# Patient Record
Sex: Female | Born: 1980 | Hispanic: Yes | Marital: Married | Smoking: Never smoker
Health system: Southern US, Community
[De-identification: ages and names within clinical notes are randomized; demographics above are authoritative.]

## PROBLEM LIST (undated history)

## (undated) ENCOUNTER — Emergency Department (HOSPITAL_COMMUNITY): Payer: Managed Care, Other (non HMO)

## (undated) DIAGNOSIS — K219 Gastro-esophageal reflux disease without esophagitis: Secondary | ICD-10-CM

## (undated) DIAGNOSIS — J45990 Exercise induced bronchospasm: Secondary | ICD-10-CM

## (undated) DIAGNOSIS — Z8489 Family history of other specified conditions: Secondary | ICD-10-CM

## (undated) DIAGNOSIS — N939 Abnormal uterine and vaginal bleeding, unspecified: Secondary | ICD-10-CM

## (undated) DIAGNOSIS — F419 Anxiety disorder, unspecified: Secondary | ICD-10-CM

## (undated) DIAGNOSIS — T7840XA Allergy, unspecified, initial encounter: Secondary | ICD-10-CM

## (undated) DIAGNOSIS — A64 Unspecified sexually transmitted disease: Secondary | ICD-10-CM

## (undated) DIAGNOSIS — M1990 Unspecified osteoarthritis, unspecified site: Secondary | ICD-10-CM

## (undated) HISTORY — DX: Unspecified osteoarthritis, unspecified site: M19.90

## (undated) HISTORY — DX: Anxiety disorder, unspecified: F41.9

## (undated) HISTORY — DX: Abnormal uterine and vaginal bleeding, unspecified: N93.9

## (undated) HISTORY — DX: Allergy, unspecified, initial encounter: T78.40XA

## (undated) HISTORY — PX: LASIK: SHX215

## (undated) HISTORY — DX: Gastro-esophageal reflux disease without esophagitis: K21.9

## (undated) HISTORY — PX: ABDOMINAL HYSTERECTOMY: SHX81

## (undated) HISTORY — DX: Unspecified sexually transmitted disease: A64

## (undated) HISTORY — PX: COLPOSCOPY: SHX161

## (undated) HISTORY — PX: WISDOM TOOTH EXTRACTION: SHX21

## (undated) HISTORY — DX: Exercise induced bronchospasm: J45.990

---

## 2006-04-04 LAB — CONVERTED CEMR LAB: Pap Smear: NORMAL

## 2008-09-09 ENCOUNTER — Encounter: Payer: Self-pay | Admitting: Family Medicine

## 2009-04-09 ENCOUNTER — Ambulatory Visit: Payer: Self-pay | Admitting: Family Medicine

## 2009-04-09 DIAGNOSIS — K219 Gastro-esophageal reflux disease without esophagitis: Secondary | ICD-10-CM

## 2009-04-09 DIAGNOSIS — J45990 Exercise induced bronchospasm: Secondary | ICD-10-CM

## 2009-04-09 HISTORY — DX: Exercise induced bronchospasm: J45.990

## 2009-04-09 HISTORY — DX: Gastro-esophageal reflux disease without esophagitis: K21.9

## 2010-05-04 NOTE — Assessment & Plan Note (Signed)
Summary: NEW TO EST//CCM   Vital Signs:  Patient profile:   30 year old female Menstrual status:  regular LMP:     03/25/2009 Height:      63.75 inches Weight:      142 pounds BMI:     24.65 Temp:     97.7 degrees F oral Pulse rate:   72 / minute Pulse rhythm:   regular Resp:     12 per minute BP sitting:   100 / 70  (left arm) Cuff size:   regular  Vitals Entered By: Sid Falcon LPN (April 09, 2009 11:17 AM) CC: New to establish LMP (date): 03/25/2009     Menstrual Status regular Enter LMP: 03/25/2009 Last PAP Result normal   History of Present Illness: New patient to establish care. 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 Also question of polycystic ovary syndrome. Patient relates that she lost over 60 pounds couple years ago through lifestyle change and exercise and feels much better overall. Reportedly had some mildly elevated blood sugars prior to that. She has noticed improvement all areas including digestive health since losing her weight. She has worked with Systems analyst in the past.  Patient on no medications. Allergies to penicillin, clindamycin, and azithromycin. Had Pap smear little over year ago and plans to set up with gynecologist.  Family history reviewed as recorded. 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 Recently moved here from [place]. No history of smoking. Occasional alcohol use.  Preventive Screening-Counseling & Management  Alcohol-Tobacco     Smoking Status: never  Caffeine-Diet-Exercise     Does Patient Exercise: yes  Allergies (verified): 1)  ! Penicillin V Potassium (Penicillin V Potassium) 2)  ! Zithromax (Azithromycin) 3)  ! Mucinex (Guaifenesin)  Past History:  Family History: Last updated: 04/09/2009 Family History of Alcoholism/Addiction, father, both grandparents, aunt Family History of Arthritis, both parents and both  grandparents Breast cancer, great grandmother, aunt Family History High cholesterol, both parents and grandparents Family History Hypertension, father, grandparents Diabetes, father, aunt  Social History: Last updated: 04/09/2009 Occupation:  Rad tech Single Never Smoked Alcohol use-yes Regular exercise-yes  Risk Factors: Exercise: yes (04/09/2009)  Risk Factors: Smoking Status: never (04/09/2009)  Past Medical History: Arthritis Asthma GERD Hay fever, allergies  Family History: Family History of Alcoholism/Addiction, father, both grandparents, aunt Family History of Arthritis, both parents and both grandparents Breast cancer, great grandmother, aunt Family History High cholesterol, both parents and grandparents Family History Hypertension, father, grandparents Diabetes, father, aunt  Social History: Occupation:  Rad Adult nurse Never Smoked Alcohol use-yes Regular exercise-yes Occupation:  employed Smoking Status:  never Does Patient Exercise:  yes  Review of Systems  The patient denies anorexia, fever, weight gain, vision loss, decreased hearing, chest pain, syncope, dyspnea on exertion, peripheral edema, prolonged cough, headaches, hemoptysis, abdominal pain, melena, hematochezia, severe indigestion/heartburn, hematuria, and incontinence.    Physical Exam  General:  Well-developed,well-nourished,in no acute distress; alert,appropriate and cooperative throughout examination Eyes:  No corneal or conjunctival inflammation noted. EOMI. Perrla. Funduscopic exam benign, without hemorrhages, exudates or papilledema. Vision grossly normal. Ears:  External ear exam shows no significant lesions or deformities.  Otoscopic examination reveals clear canals, tympanic membranes are intact bilaterally without bulging, retraction, inflammation or discharge. Hearing is grossly normal bilaterally. Nose:  External nasal examination shows no deformity or inflammation. Nasal mucosa  are pink and moist without lesions or exudates. Mouth:  Oral mucosa and oropharynx without lesions or exudates.  Teeth in good repair. Neck:  No deformities, masses, or tenderness noted. Lungs:  Normal respiratory effort, chest expands symmetrically. Lungs are clear to auscultation, no crackles or wheezes. Heart:  Normal rate and regular rhythm. S1 and S2 normal without gallop, murmur, click, rub or other extra sounds.   Impression & Recommendations:  Problem # 1:  GERD (ICD-530.81) symptoms mild, stabe, and improved with weight loss  Problem # 2:  ASTHMA, EXERCISE INDUCED (ICD-493.81)  Patient Instructions: 1)  Consider CPE sometime later this year.  Preventive Care Screening  Pap Smear:    Date:  04/04/2006    Results:  normal

## 2010-05-04 NOTE — Letter (Signed)
Summary: [facility]   Imported By: Maryln Gottron 04/14/2009 11:28:24  _____________________________________________________________________  External Attachment:    Type:   Image     Comment:   External Document

## 2010-10-25 ENCOUNTER — Encounter: Payer: Self-pay | Admitting: Family Medicine

## 2010-10-26 ENCOUNTER — Ambulatory Visit: Payer: Self-pay | Admitting: Family Medicine

## 2010-10-26 ENCOUNTER — Encounter: Payer: Self-pay | Admitting: Family Medicine

## 2012-04-04 DIAGNOSIS — A64 Unspecified sexually transmitted disease: Secondary | ICD-10-CM

## 2012-04-04 HISTORY — DX: Unspecified sexually transmitted disease: A64

## 2012-04-04 LAB — HM PAP SMEAR: HM Pap smear: ABNORMAL

## 2012-08-02 ENCOUNTER — Encounter: Payer: Self-pay | Admitting: Family Medicine

## 2012-08-02 ENCOUNTER — Ambulatory Visit (INDEPENDENT_AMBULATORY_CARE_PROVIDER_SITE_OTHER): Payer: Commercial Managed Care - PPO | Admitting: Family Medicine

## 2012-08-02 VITALS — BP 100/60 | HR 72 | Temp 98.5°F | Resp 12 | Ht 63.75 in | Wt 132.0 lb

## 2012-08-02 DIAGNOSIS — Z0000 Encounter for general adult medical examination without abnormal findings: Secondary | ICD-10-CM

## 2012-08-02 NOTE — Progress Notes (Signed)
Subjective:    Patient ID: Cathy Austin, female    DOB: 20-Feb-1981, 32 y.o.   MRN: [id number]  HPI Patient seen for well visit She continues to see gynecologist for Pap smears. She is currently taking oral contraceptives apparently secondary to a history of ovarian cyst She has no real chronic problems other than mild intermittent asthma and history of reflux Reflux is been controlled with lifestyle modification.  She's had some chronic diffuse back pains. 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 She also some chronic tension-type headaches. Complains of some generalized fatigue.  Last tetanus not confirmed but she thinks less than 10 years ago Past Medical History  Diagnosis Date  . Arthritis   . Asthma   . GERD (gastroesophageal reflux disease)   . Allergy   . ASTHMA, EXERCISE INDUCED 04/09/2009  . GERD 04/09/2009   No past surgical history on file.  reports that she has never smoked. She does not have any smokeless tobacco history on file. She reports that  drinks alcohol. Her drug history is not on file. family history includes Alcohol abuse in her father; Arthritis in her father and mother; Cancer in an unspecified family member; Diabetes in her father; Hyperlipidemia in her father and mother; and Hypertension in her father. Allergies  Allergen Reactions  . Azithromycin     REACTION: chest tigntness, diarrhea  . Guaifenesin     REACTION: throat spasms  . Penicillins     REACTION: throat spasm, rash      Review of Systems  Constitutional: Positive for fatigue. Negative for fever, activity change, appetite change and unexpected weight change.  HENT: Negative for hearing loss, ear pain, sore throat and trouble swallowing.   Eyes: Negative for visual disturbance.  Respiratory: Negative for cough and shortness of breath.   Cardiovascular: Negative for chest pain and palpitations.  Gastrointestinal: Negative for abdominal pain, diarrhea,  constipation and blood in stool.  Genitourinary: Negative for dysuria and hematuria.  Musculoskeletal: Positive for back pain. Negative for myalgias and arthralgias.  Skin: Negative for rash.  Neurological: Positive for headaches. Negative for dizziness and syncope.  Hematological: Negative for adenopathy.  Psychiatric/Behavioral: Negative for confusion and dysphoric mood.       Objective:   Physical Exam  Constitutional: She is oriented to person, place, and time. She appears well-developed and well-nourished.  HENT:  Right Ear: External ear normal.  Left Ear: External ear normal.  Mouth/Throat: Oropharynx is clear and moist.  Eyes: Pupils are equal, round, and reactive to light.  Neck: Neck supple. No thyromegaly present.  Cardiovascular: Normal rate and regular rhythm.  Exam reveals no gallop.   Pulmonary/Chest: Effort normal and breath sounds normal. No respiratory distress. She has no wheezes. She has no rales.  Abdominal: Soft. Bowel sounds are normal. She exhibits no distension and no mass. There is no tenderness. There is no rebound and no guarding.  Musculoskeletal: She exhibits no edema.  Lymphadenopathy:    She has no cervical adenopathy.  Neurological: She is alert and oriented to person, place, and time. No cranial nerve deficit.  Skin: No rash noted.  Psychiatric: She has a normal mood and affect. Her behavior is normal.          Assessment & Plan:  Health maintenance. Confirm date of last tetanus. Future labs ordered for tomorrow as she's not fasting today. She will continue to see gynecologist. We discussed possible physical therapy for her back pain but she currently has  no coverage for that. We discussed some home exercises

## 2012-08-03 ENCOUNTER — Other Ambulatory Visit: Payer: Commercial Managed Care - PPO

## 2012-08-03 LAB — CBC WITH DIFFERENTIAL/PLATELET
Basophils Absolute: 0 10*3/uL (ref 0.0–0.1)
Basophils Relative: 0.5 % (ref 0.0–3.0)
Eosinophils Absolute: 0.1 10*3/uL (ref 0.0–0.7)
Eosinophils Relative: 1.9 % (ref 0.0–5.0)
HCT: 38.2 % (ref 36.0–46.0)
Hemoglobin: 13.1 g/dL (ref 12.0–15.0)
Lymphocytes Relative: 44.5 % (ref 12.0–46.0)
Lymphs Abs: 2.2 10*3/uL (ref 0.7–4.0)
MCHC: 34.2 g/dL (ref 30.0–36.0)
MCV: 95 fl (ref 78.0–100.0)
Monocytes Absolute: 0.3 10*3/uL (ref 0.1–1.0)
Monocytes Relative: 6.3 % (ref 3.0–12.0)
Neutro Abs: 2.3 10*3/uL (ref 1.4–7.7)
Neutrophils Relative %: 46.8 % (ref 43.0–77.0)
Platelets: 308 10*3/uL (ref 150.0–400.0)
RBC: 4.02 Mil/uL (ref 3.87–5.11)
RDW: 12.6 % (ref 11.5–14.6)
WBC: 4.9 10*3/uL (ref 4.5–10.5)

## 2012-08-03 LAB — TSH: TSH: 1.25 u[IU]/mL (ref 0.35–5.50)

## 2012-08-06 LAB — BASIC METABOLIC PANEL
GFR: 109.33 mL/min (ref >60.00)
Glucose, Bld: 80 mg/dL (ref 70–99)
Potassium: 4.1 mEq/L (ref 3.5–5.1)
Sodium: 137 mEq/L (ref 135–145)

## 2012-08-06 LAB — LIPID PANEL
Cholesterol: 184 mg/dL (ref 0–200)
HDL: 84.9 mg/dL (ref >39.00)
Triglycerides: 73 mg/dL (ref 0.0–149.0)
VLDL: 14.6 mg/dL (ref 0.0–40.0)

## 2012-08-06 LAB — HEPATIC FUNCTION PANEL
Albumin: 3.8 g/dL (ref 3.5–5.2)
Total Bilirubin: 0.8 mg/dL (ref 0.3–1.2)

## 2012-08-08 NOTE — Progress Notes (Signed)
Quick Note:  Pt informed ______ 

## 2014-10-31 ENCOUNTER — Encounter: Payer: Self-pay | Admitting: Certified Nurse Midwife

## 2014-10-31 ENCOUNTER — Ambulatory Visit (INDEPENDENT_AMBULATORY_CARE_PROVIDER_SITE_OTHER): Payer: BLUE CROSS/BLUE SHIELD | Admitting: Certified Nurse Midwife

## 2014-10-31 VITALS — BP 130/76 | HR 84 | Ht 63.75 in | Wt 132.6 lb

## 2014-10-31 DIAGNOSIS — D649 Anemia, unspecified: Secondary | ICD-10-CM | POA: Diagnosis not present

## 2014-10-31 DIAGNOSIS — Z124 Encounter for screening for malignant neoplasm of cervix: Secondary | ICD-10-CM

## 2014-10-31 DIAGNOSIS — Z01419 Encounter for gynecological examination (general) (routine) without abnormal findings: Secondary | ICD-10-CM | POA: Diagnosis not present

## 2014-10-31 DIAGNOSIS — Z0000 Encounter for general adult medical examination without abnormal findings: Secondary | ICD-10-CM | POA: Diagnosis not present

## 2014-10-31 DIAGNOSIS — N898 Other specified noninflammatory disorders of vagina: Secondary | ICD-10-CM

## 2014-10-31 LAB — POCT URINALYSIS DIPSTICK
Leukocytes, UA: NEGATIVE
PH UA: 5
Urobilinogen, UA: NEGATIVE

## 2014-10-31 LAB — CBC
HCT: 31.8 % — ABNORMAL LOW (ref 36.0–46.0)
Hemoglobin: 9.9 g/dL — ABNORMAL LOW (ref 12.0–15.0)
MCH: 26.5 pg (ref 26.0–34.0)
MCHC: 31.1 g/dL (ref 30.0–36.0)
MCV: 85.3 fL (ref 78.0–100.0)
MPV: 8.3 fL — AB (ref 8.6–12.4)
Platelets: 313 10*3/uL (ref 150–400)
RBC: 3.73 MIL/uL — AB (ref 3.87–5.11)
RDW: 14.5 % (ref 11.5–15.5)
WBC: 5.1 10*3/uL (ref 4.0–10.5)

## 2014-10-31 LAB — FERRITIN: FERRITIN: 4 ng/mL — AB (ref 10–291)

## 2014-10-31 MED ORDER — FUSION PLUS PO CAPS: 1.0000 | ORAL_CAPSULE | Freq: Every day | ORAL | Status: DC

## 2014-10-31 NOTE — Patient Instructions (Signed)
EXERCISE AND DIET:  We recommended that you start or continue a regular exercise program for good health. Regular exercise means any activity that makes your heart beat faster and makes you sweat.  We recommend exercising at least 30 minutes per day at least 3 days a week, preferably 4 or 5.  We also recommend a diet low in fat and sugar.  Inactivity, poor dietary choices and obesity can cause diabetes, heart attack, stroke, and kidney damage, among others.    ALCOHOL AND SMOKING:  Women should limit their alcohol intake to no more than 7 drinks/beers/glasses of wine (combined, not each!) per week. Moderation of alcohol intake to this level decreases your risk of breast cancer and liver damage. And of course, no recreational drugs are part of a healthy lifestyle.  And absolutely no smoking or even second hand smoke. Most people know smoking can cause heart and lung diseases, but did you know it also contributes to weakening of your bones? Aging of your skin?  Yellowing of your teeth and nails?  CALCIUM AND VITAMIN D:  Adequate intake of calcium and Vitamin D are recommended.  The recommendations for exact amounts of these supplements seem to change often, but generally speaking 600 mg of calcium (either carbonate or citrate) and 800 units of Vitamin D per day seems prudent. Certain women may benefit from higher intake of Vitamin D.  If you are among these women, your doctor will have told you during your visit.    PAP SMEARS:  Pap smears, to check for cervical cancer or precancers,  have traditionally been done yearly, although recent scientific advances have shown that most women can have pap smears less often.  However, every woman still should have a physical exam from her gynecologist every year. It will include a breast check, inspection of the vulva and vagina to check for abnormal growths or skin changes, a visual exam of the cervix, and then an exam to evaluate the size and shape of the uterus and  ovaries.  And after 33 years of age, a rectal exam is indicated to check for rectal cancers. We will also provide age appropriate advice regarding health maintenance, like when you should have certain vaccines, screening for sexually transmitted diseases, bone density testing, colonoscopy, mammograms, etc.   MAMMOGRAMS:  All women over 40 years old should have a yearly mammogram. Many facilities now offer a "3D" mammogram, which may cost around $50 extra out of pocket. If possible,  we recommend you accept the option to have the 3D mammogram performed.  It both reduces the number of women who will be called back for extra views which then turn out to be normal, and it is better than the routine mammogram at detecting truly abnormal areas.    COLONOSCOPY:  Colonoscopy to screen for colon cancer is recommended for all women at age 50.  We know, you hate the idea of the prep.  We agree, BUT, having colon cancer and not knowing it is worse!!  Colon cancer so often starts as a polyp that can be seen and removed at colonscopy, which can quite literally save your life!  And if your first colonoscopy is normal and you have no family history of colon cancer, most women don't have to have it again for 10 years.  Once every ten years, you can do something that may end up saving your life, right?  We will be happy to help you get it scheduled when you are ready.    Be sure to check your insurance coverage so you understand how much it will cost.  It may be covered as a preventative service at no cost, but you should check your particular policy.        Iron Deficiency Anemia Anemia is a condition in which there are less red blood cells or hemoglobin in the blood than normal. Hemoglobin is the part of red blood cells that carries oxygen. Iron deficiency anemia is anemia caused by too little iron. It is the most common type of anemia. It may leave you tired and short of breath. CAUSES   Lack of iron in the  diet.  Poor absorption of iron, as seen with intestinal disorders.  Intestinal bleeding.  Heavy periods. SIGNS AND SYMPTOMS  Mild anemia may not be noticeable. Symptoms may include:  Fatigue.  Headache.  Pale skin.  Weakness.  Tiredness.  Shortness of breath.  Dizziness.  Cold hands and feet.  Fast or irregular heartbeat. DIAGNOSIS  Diagnosis requires a thorough evaluation and physical exam by your health care provider. Blood tests are generally used to confirm iron deficiency anemia. Additional tests may be done to find the underlying cause of your anemia. These may include:  Testing for blood in the stool (fecal occult blood test).  A procedure to see inside the colon and rectum (colonoscopy).  A procedure to see inside the esophagus and stomach (endoscopy). TREATMENT  Iron deficiency anemia is treated by correcting the cause of the deficiency. Treatment may involve:  Adding iron-rich foods to your diet.  Taking iron supplements. Pregnant or breastfeeding women need to take extra iron because their normal diet usually does not provide the required amount.  Taking vitamins. Vitamin C improves the absorption of iron. Your health care provider may recommend that you take your iron tablets with a glass of orange juice or vitamin C supplement.  Medicines to make heavy menstrual flow lighter.  Surgery. HOME CARE INSTRUCTIONS   Take iron as directed by your health care provider.  If you cannot tolerate taking iron supplements by mouth, talk to your health care provider about taking them through a vein (intravenously) or an injection into a muscle.  For the best iron absorption, iron supplements should be taken on an empty stomach. If you cannot tolerate them on an empty stomach, you may need to take them with food.  Do not drink milk or take antacids at the same time as your iron supplements. Milk and antacids may interfere with the absorption of iron.  Iron  supplements can cause constipation. Make sure to include fiber in your diet to prevent constipation. A stool softener may also be recommended.  Take vitamins as directed by your health care provider.  Eat a diet rich in iron. Foods high in iron include liver, lean beef, whole-grain bread, eggs, dried fruit, and dark green leafy vegetables. SEEK IMMEDIATE MEDICAL CARE IF:   You faint. If this happens, do not drive. Call your local emergency services (911 in [place] if no other help is available.  You have chest pain.  You feel nauseous or vomit.  You have severe or increased shortness of breath with activity.  You feel weak.  You have a rapid heartbeat.  You have unexplained sweating.  You become light-headed when getting up from a chair or bed. MAKE SURE YOU:   Understand these instructions.  Will watch your condition.  Will get help right away if you are not doing well or get worse. Document Released:  03/18/2000 Document Revised: 03/26/2013 Document Reviewed: 11/26/2012 [facility] Patient Information 2015 [place], [place]. This information is not intended to replace advice given to you by your health care provider. Make sure you discuss any questions you have with your health care provider.

## 2014-10-31 NOTE — Progress Notes (Signed)
34 y.o. G0P0000 Married Caucasian Fe here to establish gyn care and for annual exam. History of DUB  with last cycle lasting up to 4 weeks with spotting. Periods prior to were normal. Has had some fatigue during the past few weeks. Feels better today. Patient has history of abnormal pap smear with HPV/colposcopy, repeat paps negative. History of? Anemia in past. Has had some bowel changes with constipation,loose stools, denies blood in stool or tarry stools. Feels it stress related with new work atmosphere. X ray tech with Dialysis patients. Eats well, no nausea. No UTI symptoms. Sees urgent care if needed. No other health issues today.  Patient's last menstrual period was 10/02/2014 (approximate).          Sexually active: Yes.    The current method of family planning is none, Husband has vasectomy.    Exercising: Yes.    cardio, strength 4-5x/wk Smoker:  no  Health Maintenance: Pap:  8 months ago wnl ; hpv diag in 2014 had colpo bx correlated with hpv recommendation for pap every 6 months MMG:  Never had one  Colonoscopy:  Never had one  BMD:   Never had one TDaP: up to date Labs: Hgb: 9.7 ; Urine: wbc's +   reports that she has never smoked. She does not have any smokeless tobacco history on file. She reports that she drinks about 0.6 oz of alcohol per week.  Past Medical History  Diagnosis Date  . Arthritis   . Asthma   . GERD (gastroesophageal reflux disease)   . Allergy   . ASTHMA, EXERCISE INDUCED 04/09/2009  . GERD 04/09/2009  . Abnormal uterine bleeding   . Anxiety   . STD (sexually transmitted disease) 2014    HPV    Past Surgical History  Procedure Laterality Date  . Wisdom tooth extraction  Highschool    Current Outpatient Prescriptions  Medication Sig Dispense Refill  . Docusate Calcium (STOOL SOFTENER PO) Take by mouth.    . Multiple Vitamins-Minerals (MULTIVITAMIN WITH MINERALS) tablet Take 1 tablet by mouth daily.    . Psyllium (METAMUCIL) 28.3 % POWD Take by  mouth as needed.     No current facility-administered medications for this visit.    Family History  Problem Relation Age of Onset  . Arthritis Mother   . Hyperlipidemia Mother   . Alcohol abuse Father     grandparents, aunt  . Arthritis Father     grandparents  . Hyperlipidemia Father     grandparents  . Breast cancer      breast/great-grandmother, aunt  . Melanoma Mother   . Crohn's disease Sister     ROS:  Pertinent items are noted in HPI.  Otherwise, a comprehensive ROS was negative.  Exam:   BP 130/76 mmHg  Pulse 84  Ht 5' 3.75" (1.619 m)  Wt 132 lb 9.6 oz (60.147 kg)  BMI 22.95 kg/m2  LMP 10/02/2014 (Approximate) Height: 5' 3.75" (161.9 cm) Ht Readings from Last 3 Encounters:  10/31/14 5' 3.75" (1.619 m)  08/02/12 5' 3.75" (1.619 m)  04/09/09 5' 3.75" (1.619 m)    General appearance: alert, cooperative and appears stated age Head: Normocephalic, without obvious abnormality, atraumatic Neck: no adenopathy, supple, symmetrical, trachea midline and thyroid normal to inspection and palpation Lungs: clear to auscultation bilaterally Breasts: normal appearance, no masses or tenderness, No nipple retraction or dimpling, No nipple discharge or bleeding, No axillary or supraclavicular adenopathy Heart: regular rate and rhythm Abdomen: soft, non-tender; no masses,  no  organomegaly Extremities: extremities normal, atraumatic, no cyanosis or edema Skin: Skin color, texture, turgor normal. No rashes or lesions Lymph nodes: Cervical, supraclavicular, and axillary nodes normal. No abnormal inguinal nodes palpated Neurologic: Grossly normal   Pelvic: External genitalia:  no lesions              Urethra:  normal appearing urethra with no masses, tenderness or lesions              Bartholin's and Skene's: normal                 Vagina: normal appearing vagina with normal color and odorous copious discharge, no lesions, Affirm taken              Cervix: normal,non tender,no  lesions              Pap taken: Yes.   Bimanual Exam:  Uterus:  normal size, contour, position, consistency, mobility, non-tender              Adnexa: normal adnexa and no mass, fullness, tenderness               Rectovaginal: Confirms               Anus:  normal sphincter tone, no lesions, no hemorrhoids noted  Chaperone present: Yes  A:  Well Woman with normal exam  Contraception spouse vasectomy  Anemia  DUB with last cycle only,   Bowel changes ? Stress related  Vaginal discharge  R/O infection  P:   Reviewed health and wellness pertinent to exam  Discussed anemia finding and need for treatment with Rx. Will also like to do iron profile, agreeable. Work on iron foods in diet.  Rx Fusion plus with instructions see order  Labs: CBC,Iron,Ferritin, IBC  Discussed keeping menses record and advise if cycle is abnormal again, may need evaluation. Patient agreeable  Discussed increasing fiber in diet and starting on oral refrigerated probiotic to see if resolves. If not feel GI evaluation important. Patient will advise  Will treat if affirm indicates  Pap smear taken with HPVHR   counseled on breast self exam, feminine hygiene, adequate intake of calcium and vitamin D, diet and exercise  return annually or prn  An After Visit Summary was printed and given to the patient.

## 2014-11-01 LAB — IBC PANEL
%SAT: 7 % — ABNORMAL LOW (ref 20–55)
TIBC: 454 ug/dL (ref 250–470)
UIBC: 423 ug/dL — ABNORMAL HIGH (ref 125–400)

## 2014-11-01 LAB — IRON: Iron: 31 ug/dL — ABNORMAL LOW (ref 42–145)

## 2014-11-01 LAB — WET PREP BY MOLECULAR PROBE
Candida species: NEGATIVE
Gardnerella vaginalis: POSITIVE — AB
Trichomonas vaginosis: NEGATIVE

## 2014-11-03 ENCOUNTER — Other Ambulatory Visit: Payer: Self-pay

## 2014-11-03 MED ORDER — HYLAFEM VA SUPP: 1.0000 | Freq: Every day | VAGINAL | Status: DC

## 2014-11-03 NOTE — Telephone Encounter (Signed)
-----   Message from Kem Boroughs, [place] sent at 11/03/2014  8:41 AM EDT ----- Please let pt. Know that Affirm is positive for BV.  She may have Rx for Hylafem # 2 sent to [place] or her pharmacy choice.  The rest of labs with her CBC and iron studies will need to be reviewed by ms. Debbie for further recommendations.

## 2014-11-03 NOTE — Telephone Encounter (Signed)
Please check number to dispense & send to pharmacy. Then close encounter.

## 2014-11-04 ENCOUNTER — Telehealth: Payer: Self-pay

## 2014-11-04 ENCOUNTER — Other Ambulatory Visit: Payer: Self-pay | Admitting: Certified Nurse Midwife

## 2014-11-04 DIAGNOSIS — D509 Iron deficiency anemia, unspecified: Secondary | ICD-10-CM

## 2014-11-04 NOTE — Telephone Encounter (Signed)
Patient notified of results. See lab 

## 2014-11-04 NOTE — Telephone Encounter (Signed)
lmtcb

## 2014-11-04 NOTE — Telephone Encounter (Signed)
-----   Message from Regina Eck, CNM sent at 11/04/2014  7:49 AM EDT ----- Notify patient that Iron is low at 31 normal is 42-145 Ferritin also low at 4 normal >10 Iron saturation is low at 7 normal > than 20 CBC low HGB at 9.9 and profile indicating anemia. Would like her to take Fusion plus bid she has RX  Repeat labs in one month order in

## 2014-11-05 LAB — IPS PAP TEST WITH HPV

## 2014-11-05 LAB — HEMOGLOBIN, FINGERSTICK: Hemoglobin, fingerstick: 9.7 g/dL — ABNORMAL LOW (ref 12.0–16.0)

## 2014-11-05 NOTE — Progress Notes (Signed)
Reviewed personally.  M. Suzanne Ahlaya Ende, MD.  

## 2014-11-19 ENCOUNTER — Other Ambulatory Visit: Payer: Self-pay | Admitting: Obstetrics & Gynecology

## 2014-11-19 DIAGNOSIS — D649 Anemia, unspecified: Secondary | ICD-10-CM

## 2014-11-19 MED ORDER — FUSION PLUS PO CAPS: 1.0000 | ORAL_CAPSULE | Freq: Two times a day (BID) | ORAL | Status: DC

## 2014-11-19 NOTE — Telephone Encounter (Signed)
Medication refill request: Fusion Plus Last AEX:  10/31/14 DL Next AEX: 11/04/15 DL Last MMG (if hormonal medication request): None Refill authorized: 10/31/14 #30/2R. Today please advise.

## 2014-11-19 NOTE — Telephone Encounter (Signed)
Patient is requesting a new prescription for iron pills. It was written for one pill a day but DL has her taking two a day and she is completely out now. The pharmacy told her she needs a new prescription. [facility] is using [facility] [address]

## 2014-11-24 ENCOUNTER — Telehealth: Payer: Self-pay | Admitting: Certified Nurse Midwife

## 2014-11-24 NOTE — Telephone Encounter (Signed)
Patient is requesting a new prescription for fusion plus to be sent to [facility] in [place]. [facility].

## 2014-11-24 NOTE — Telephone Encounter (Signed)
Rx sent 11/19/14 #60/1R  Patient needs to call [facility] and have them [facility].  Called patient. Verbalized understanding   Encounter closed.

## 2014-12-05 ENCOUNTER — Other Ambulatory Visit (INDEPENDENT_AMBULATORY_CARE_PROVIDER_SITE_OTHER): Payer: 59

## 2014-12-05 DIAGNOSIS — D509 Iron deficiency anemia, unspecified: Secondary | ICD-10-CM

## 2014-12-05 LAB — IRON: IRON: 44 ug/dL (ref 40–190)

## 2014-12-05 LAB — CBC
HEMATOCRIT: 36.7 % (ref 36.0–46.0)
HEMOGLOBIN: 11.8 g/dL — AB (ref 12.0–15.0)
MCH: 29.1 pg (ref 26.0–34.0)
MCHC: 32.2 g/dL (ref 30.0–36.0)
MCV: 90.6 fL (ref 78.0–100.0)
MPV: 8.8 fL (ref 8.6–12.4)
Platelets: 273 10*3/uL (ref 150–400)
RBC: 4.05 MIL/uL (ref 3.87–5.11)
RDW: 17.7 % — ABNORMAL HIGH (ref 11.5–15.5)
WBC: 4.6 10*3/uL (ref 4.0–10.5)

## 2014-12-05 LAB — FERRITIN: Ferritin: 17 ng/mL (ref 10–291)

## 2014-12-05 LAB — IBC PANEL
%SAT: 14 % (ref 11–50)
TIBC: 321 ug/dL (ref 250–450)
UIBC: 277 ug/dL (ref 125–400)

## 2014-12-10 ENCOUNTER — Other Ambulatory Visit: Payer: Self-pay | Admitting: Certified Nurse Midwife

## 2014-12-10 DIAGNOSIS — K219 Gastro-esophageal reflux disease without esophagitis: Secondary | ICD-10-CM

## 2014-12-10 DIAGNOSIS — R194 Change in bowel habit: Secondary | ICD-10-CM

## 2014-12-24 ENCOUNTER — Telehealth: Payer: Self-pay | Admitting: Certified Nurse Midwife

## 2014-12-24 NOTE — Telephone Encounter (Signed)
Patient still has symptoms of a vaginal infection that she came in to see Ms Jackelyn Poling for and would like a prescription called in.

## 2014-12-24 NOTE — Telephone Encounter (Signed)
Spoke with patient. Patient states that she is having the same symptoms she was having when she was last seen. Patient was last seen on 10/31/2014 and treated for BV with Hylafem. Advised with recurrence of symptoms and being seen last two months ago she will need to be seen in office for evaluation to ensure proper treatment. Patient declines. Patient states that she is seeing another doctor tomorrow and will see if this can be treated at their practice. Advised if she needs anything or would like to come in to see Cathy Austin CNM for evaluation to give our office a call.  Routing to provider for final review. Patient agreeable to disposition. Will close encounter.

## 2015-11-04 ENCOUNTER — Encounter: Payer: Self-pay | Admitting: Certified Nurse Midwife

## 2015-11-04 ENCOUNTER — Ambulatory Visit (INDEPENDENT_AMBULATORY_CARE_PROVIDER_SITE_OTHER): Payer: 59 | Admitting: Certified Nurse Midwife

## 2015-11-04 VITALS — BP 108/68 | HR 76 | Ht 63.5 in | Wt 130.0 lb

## 2015-11-04 DIAGNOSIS — Z0000 Encounter for general adult medical examination without abnormal findings: Secondary | ICD-10-CM

## 2015-11-04 DIAGNOSIS — N6320 Unspecified lump in the left breast, unspecified quadrant: Secondary | ICD-10-CM

## 2015-11-04 DIAGNOSIS — N898 Other specified noninflammatory disorders of vagina: Secondary | ICD-10-CM

## 2015-11-04 DIAGNOSIS — Z01419 Encounter for gynecological examination (general) (routine) without abnormal findings: Secondary | ICD-10-CM

## 2015-11-04 DIAGNOSIS — N63 Unspecified lump in breast: Secondary | ICD-10-CM

## 2015-11-04 DIAGNOSIS — N6325 Unspecified lump in the left breast, overlapping quadrants: Secondary | ICD-10-CM

## 2015-11-04 NOTE — Progress Notes (Signed)
35 y.o. G0P0000 Married  Caucasian Fe here for annual exam. Periods occasional spotting just when wiping. Not sure if mid cycle. Period heavy one day and 1-2 days of light with spotting is her normal. Doesn't keep up with them on a calendar, because she is not worried about pregnancy. Saw Dr.Mann for colonoscopy and had one polyp.Bowel issues have improved now, no concerns. Sees Urgent care if needed. No other health issues today.  Patient's last menstrual period was 10/04/2015 (approximate).          Sexually active: Yes.    The current method of family planning is vasectomy.    Exercising: Yes.    Cardio, strength  Smoker:  no  Health Maintenance: Pap:  10-31-14 neg HPV HR neg hx of abnormal TDaP:  Up to date Hep C and HIV: Done at work  Labs: Not today Self breast exam: No   reports that she has never smoked. She has never used smokeless tobacco. She reports that she drinks about 0.6 oz of alcohol per week . She reports that she does not use drugs.  Past Medical History:  Diagnosis Date  . Abnormal uterine bleeding   . Allergy   . Anxiety   . Arthritis   . Asthma   . ASTHMA, EXERCISE INDUCED 04/09/2009  . GERD 04/09/2009  . GERD (gastroesophageal reflux disease)   . 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 WISDOM TOOTH EXTRACTION  Highschool    Current Outpatient Prescriptions  Medication Sig Dispense Refill  . Multiple Vitamins-Minerals (MULTIVITAMIN WITH MINERALS) tablet Take 1 tablet by mouth daily.    . NON FORMULARY Plexus Bio Cleanse - Probiotic for IBS     No current facility-administered medications for this visit.     Family History  Problem Relation Age of Onset  . Arthritis Mother   . Hyperlipidemia Mother   . Melanoma Mother   . Alcohol abuse Father     grandparents, aunt  . Arthritis Father     grandparents  . Hyperlipidemia Father     grandparents  . Breast cancer     breast/great-grandmother, aunt  . Crohn's disease Sister     ROS:  Pertinent items are noted in HPI.  Otherwise, a comprehensive ROS was negative.  Exam:   BP 108/68 (BP Location: Right Arm, Patient Position: Sitting, Cuff Size: Normal)   Pulse 76   Ht 5' 3.5" (1.613 m)   Wt 130 lb (59 kg)   LMP 10/04/2015 (Approximate)   BMI 22.67 kg/m  Height: 5' 3.5" (161.3 cm) Ht Readings from Last 3 Encounters:  11/04/15 5' 3.5" (1.613 m)  10/31/14 5' 3.75" (1.619 m)  08/02/12 5' 3.75" (1.619 m)    General appearance: alert, cooperative and appears stated age Head: Normocephalic, without obvious abnormality, atraumatic Neck: no adenopathy, supple, symmetrical, trachea midline and thyroid normal to inspection and palpation Lungs: clear to auscultation bilaterally Breasts: normal appearance, no masses or tenderness, No nipple retraction or dimpling, No nipple discharge or bleeding, No axillary or supraclavicular adenopathy, left breast mass at 3 o'clock at breast edge, slightly mobile, non tender,  pea size Heart: regular rate and rhythm Abdomen: soft, non-tender; no masses,  no organomegaly Extremities: extremities normal, atraumatic, no cyanosis or edema Skin: Skin color, texture, turgor normal. No rashes or lesions Lymph nodes: Cervical, supraclavicular, and axillary nodes normal. No abnormal inguinal nodes palpated Neurologic: Grossly normal   Pelvic: External genitalia:  no lesions              Urethra:  normal appearing urethra with no masses, tenderness or lesions              Bartholin's and Skene's: normal                 Vagina: thick slightly odorous  Discharge in vagina with normal color, no lesions              Cervix: no cervical motion tenderness, no lesions and nulliparous appearance              Pap taken: No. Bimanual Exam:  Uterus:  normal size, contour, position, consistency, mobility, non-tender              Adnexa: normal adnexa and no mass, fullness, tenderness                Rectovaginal: Confirms               Anus:  normal sphincter tone, no lesions  Chaperone present: yes  A:  Well Woman with normal exam  Contraception spouse vasectomy  Left breast mass  R/O vaginal infection  P:   Reviewed health and wellness pertinent to exam  Discussed left breast mass finding and need for evaluation with mammogram and [place]. Patient agreeable. Patient will be scheduled prior to leaving office.  Discussed importance of notifying if no period in 3 months due to concerns with heavy bleeding of hyperplasia. Patient will advise if no menses. Will try to keep up.  Will treat per affirm if indicated  Lab Affirm  Pap smear as above not taken  counseled on breast self exam, adequate intake of calcium and vitamin D, diet and exercise  return annually or prn  An After Visit Summary was printed and given to the patient.

## 2015-11-04 NOTE — Patient Instructions (Signed)
General topics  Next pap or exam is  due in 1 year Take a Women's multivitamin Take 1200 mg. of calcium daily - prefer dietary If any concerns in interim to call back  Breast Self-Awareness Practicing breast self-awareness may pick up problems early, prevent significant medical complications, and possibly save your life. By practicing breast self-awareness, you can become familiar with how your breasts look and feel and if your breasts are changing. This allows you to notice changes early. It can also offer you some reassurance that your breast health is good. One way to learn what is normal for your breasts and whether your breasts are changing is to do a breast self-exam. If you find a lump or something that was not present in the past, it is best to contact your caregiver right away. Other findings that should be evaluated by your caregiver include nipple discharge, especially if it is bloody; skin changes or reddening; areas where the skin seems to be pulled in (retracted); or new lumps and bumps. Breast pain is seldom associated with cancer (malignancy), but should also be evaluated by a caregiver. BREAST SELF-EXAM The best time to examine your breasts is 5 7 days after your menstrual period is over.  ExitCare Patient Information 2013 [employer].   Exercise to Stay Healthy Exercise helps you become and stay healthy. EXERCISE IDEAS AND TIPS Choose exercises that:  You enjoy.  Fit into your day. You do not need to exercise really hard to be healthy. You can do exercises at a slow or medium level and stay healthy. You can:  Stretch before and after working out.  Try yoga, Pilates, or tai chi.  Lift weights.  Walk fast, swim, jog, run, climb stairs, bicycle, dance, or rollerskate.  Take aerobic classes. Exercises that burn about 150 calories:  Running 1  miles in 15 minutes.  Playing volleyball for 45 to 60 minutes.  Washing and waxing a car for 45 to 60  minutes.  Playing touch football for 45 minutes.  Walking 1  miles in 35 minutes.  Pushing a stroller 1  miles in 30 minutes.  Playing basketball for 30 minutes.  Raking leaves for 30 minutes.  Bicycling 5 miles in 30 minutes.  Walking 2 miles in 30 minutes.  Dancing for 30 minutes.  Shoveling snow for 15 minutes.  Swimming laps for 20 minutes.  Walking up stairs for 15 minutes.  Bicycling 4 miles in 15 minutes.  Gardening for 30 to 45 minutes.  Jumping rope for 15 minutes.  Washing windows or floors for 45 to 60 minutes. Document Released: 04/23/2010 Document Revised: 06/13/2011 Document Reviewed: 04/23/2010 ExitCare Patient Information 2013 [employer].   Other topics ( that may be useful information):    Sexually Transmitted Disease Sexually transmitted disease (STD) refers to any infection that is passed from person to person during sexual activity. This may happen by way of saliva, semen, blood, vaginal mucus, or urine. Common STDs include:  Gonorrhea.  Chlamydia.  Syphilis.  HIV/AIDS.  Genital herpes.  Hepatitis B and C.  Trichomonas.  Human papillomavirus (HPV).  Pubic lice. CAUSES  An STD may be spread by bacteria, virus, or parasite. A person can get an STD by:  Sexual intercourse with an infected person.  Sharing sex toys with an infected person.  Sharing needles with an infected person.  Having intimate contact with the genitals, mouth, or rectal areas of an infected person. SYMPTOMS  Some people may not have any symptoms, but   they can still pass the infection to others. Different STDs have different symptoms. Symptoms include:  Painful or bloody urination.  Pain in the pelvis, abdomen, vagina, anus, throat, or eyes.  Skin rash, itching, irritation, growths, or sores (lesions). These usually occur in the genital or anal area.  Abnormal vaginal discharge.  Penile discharge in men.  Soft, flesh-colored skin growths in the  genital or anal area.  Fever.  Pain or bleeding during sexual intercourse.  Swollen glands in the groin area.  Yellow skin and eyes (jaundice). This is seen with hepatitis. DIAGNOSIS  To make a diagnosis, your caregiver may:  Take a medical history.  Perform a physical exam.  Take a specimen (culture) to be examined.  Examine a sample of discharge under a microscope.  Perform blood test TREATMENT   Chlamydia, gonorrhea, trichomonas, and syphilis can be cured with antibiotic medicine.  Genital herpes, hepatitis, and HIV can be treated, but not cured, with prescribed medicines. The medicines will lessen the symptoms.  Genital warts from HPV can be treated with medicine or by freezing, burning (electrocautery), or surgery. Warts may come back.  HPV is a virus and cannot be cured with medicine or surgery.However, abnormal areas may be followed very closely by your caregiver and may be removed from the cervix, vagina, or vulva through office procedures or surgery. If your diagnosis is confirmed, your recent sexual partners need treatment. This is true even if they are symptom-free or have a negative culture or evaluation. They should not have sex until their caregiver says it is okay. HOME CARE INSTRUCTIONS  All sexual partners should be informed, tested, and treated for all STDs.  Take your antibiotics as directed. Finish them even if you start to feel better.  Only take over-the-counter or prescription medicines for pain, discomfort, or fever as directed by your caregiver.  Rest.  Eat a balanced diet and drink enough fluids to keep your urine clear or pale yellow.  Do not have sex until treatment is completed and you have followed up with your caregiver. STDs should be checked after treatment.  Keep all follow-up appointments, Pap tests, and blood tests as directed by your caregiver.  Only use latex condoms and water-soluble lubricants during sexual activity. Do not use  petroleum jelly or oils.  Avoid alcohol and illegal drugs.  Get vaccinated for HPV and hepatitis. If you have not received these vaccines in the past, talk to your caregiver about whether one or both might be right for you.  Avoid risky sex practices that can break the skin. The only way to avoid getting an STD is to avoid all sexual activity.Latex condoms and dental dams (for oral sex) will help lessen the risk of getting an STD, but will not completely eliminate the risk. SEEK MEDICAL CARE IF:   You have a fever.  You have any new or worsening symptoms. Document Released: 06/11/2002 Document Revised: 06/13/2011 Document Reviewed: 06/18/2010 [facility] Patient Information 2013 [employer].    Domestic Abuse You are being battered or abused if someone close to you hits, pushes, or physically hurts you in any way. You also are being abused if you are forced into activities. You are being sexually abused if you are forced to have sexual contact of any kind. You are being emotionally abused if you are made to feel worthless or if you are constantly threatened. It is important to remember that help is available. No one has the right to abuse you. PREVENTION OF FURTHER  ABUSE  Learn the warning signs of danger. This varies with situations but may include: the use of alcohol, threats, isolation from friends and family, or forced sexual contact. Leave if you feel that violence is going to occur.  If you are attacked or beaten, report it to the police so the abuse is documented. You do not have to press charges. The police can protect you while you or the attackers are leaving. Get the officer's name and badge number and a copy of the report.  Find someone you can trust and tell them what is happening to you: your caregiver, a nurse, clergy member, close friend or family member. Feeling ashamed is natural, but remember that you have done nothing wrong. No one deserves abuse. Document Released:  03/18/2000 Document Revised: 06/13/2011 Document Reviewed: 05/27/2010 ExitCare Patient Information 2013 [employer].    How Much is Too Much Alcohol? Drinking too much alcohol can cause injury, accidents, and health problems. These types of problems can include:   Car crashes.  Falls.  Family fighting (domestic violence).  Drowning.  Fights.  Injuries.  Burns.  Damage to certain organs.  Having a baby with birth defects. ONE DRINK CAN BE TOO MUCH WHEN YOU ARE:  Working.  Pregnant or breastfeeding.  Taking medicines. Ask your doctor.  Driving or planning to drive. If you or someone you know has a drinking problem, get help from a doctor.  Document Released: 01/15/2009 Document Revised: 06/13/2011 Document Reviewed: 01/15/2009 ExitCare Patient Information 2013 [employer].   Smoking Hazards Smoking cigarettes is extremely bad for your health. Tobacco smoke has over 200 known poisons in it. There are over 60 chemicals in tobacco smoke that cause cancer. Some of the chemicals found in cigarette smoke include:   Cyanide.  Benzene.  Formaldehyde.  Methanol (wood alcohol).  Acetylene (fuel used in welding torches).  Ammonia. Cigarette smoke also contains the poisonous gases nitrogen oxide and carbon monoxide.  Cigarette smokers have an increased risk of many serious medical problems and Smoking causes approximately:  90% of all lung cancer deaths in men.  80% of all lung cancer deaths in women.  90% of deaths from chronic obstructive lung disease. Compared with nonsmokers, smoking increases the risk of:  Coronary heart disease by 2 to 4 times.  Stroke by 2 to 4 times.  Men developing lung cancer by 23 times.  Women developing lung cancer by 13 times.  Dying from chronic obstructive lung diseases by 12 times.  . Smoking is the most preventable cause of death and disease in our society.  WHY IS SMOKING ADDICTIVE?  Nicotine is the chemical  agent in tobacco that is capable of causing addiction or dependence.  When you smoke and inhale, nicotine is absorbed rapidly into the bloodstream through your lungs. Nicotine absorbed through the lungs is capable of creating a powerful addiction. Both inhaled and non-inhaled nicotine may be addictive.  Addiction studies of cigarettes and spit tobacco show that addiction to nicotine occurs mainly during the teen years, when young people begin using tobacco products. WHAT ARE THE BENEFITS OF QUITTING?  There are many health benefits to quitting smoking.   Likelihood of developing cancer and heart disease decreases. Health improvements are seen almost immediately.  Blood pressure, pulse rate, and breathing patterns start returning to normal soon after quitting. QUITTING SMOKING   American Lung Association - 1-800-LUNGUSA  American Cancer Society - 1-800-ACS-2345 Document Released: 04/28/2004 Document Revised: 06/13/2011 Document Reviewed: 12/31/2008 ExitCare Patient Information 2013 ExitCare[employer]   LLC.   Stress Management Stress is a state of physical or mental tension that often results from changes in your life or normal routine. Some common causes of stress are:  Death of a loved one.  Injuries or severe illnesses.  Getting fired or changing jobs.  Moving into a new home. Other causes may be:  Sexual problems.  Business or financial losses.  Taking on a large debt.  Regular conflict with someone at home or at work.  Constant tiredness from lack of sleep. It is not just bad things that are stressful. It may be stressful to:  Win the lottery.  Get married.  Buy a new car. The amount of stress that can be easily tolerated varies from person to person. Changes generally cause stress, regardless of the types of change. Too much stress can affect your health. It may lead to physical or emotional problems. Too little stress (boredom) may also become stressful. SUGGESTIONS TO  REDUCE STRESS:  Talk things over with your family and friends. It often is helpful to share your concerns and worries. If you feel your problem is serious, you may want to get help from a professional counselor.  Consider your problems one at a time instead of lumping them all together. Trying to take care of everything at once may seem impossible. List all the things you need to do and then start with the most important one. Set a goal to accomplish 2 or 3 things each day. If you expect to do too many in a single day you will naturally fail, causing you to feel even more stressed.  Do not use alcohol or drugs to relieve stress. Although you may feel better for a short time, they do not remove the problems that caused the stress. They can also be habit forming.  Exercise regularly - at least 3 times per week. Physical exercise can help to relieve that "uptight" feeling and will relax you.  The shortest distance between despair and hope is often a good night's sleep.  Go to bed and get up on time allowing yourself time for appointments without being rushed.  Take a short "time-out" period from any stressful situation that occurs during the day. Close your eyes and take some deep breaths. Starting with the muscles in your face, tense them, hold it for a few seconds, then relax. Repeat this with the muscles in your neck, shoulders, hand, stomach, back and legs.  Take good care of yourself. Eat a balanced diet and get plenty of rest.  Schedule time for having fun. Take a break from your daily routine to relax. HOME CARE INSTRUCTIONS   Call if you feel overwhelmed by your problems and feel you can no longer manage them on your own.  Return immediately if you feel like hurting yourself or someone else. Document Released: 09/14/2000 Document Revised: 06/13/2011 Document Reviewed: 05/07/2007 ExitCare Patient Information 2013 [employer]Care[employer].   

## 2015-11-04 NOTE — Progress Notes (Signed)
Patient is scheduled for Bilateral Breast Diagnostic Mammogram and L Breast Ultrasound at [facility] on 11/09/15 at 1430 . Patient agreeable to time/date/location. Follow up breast check with Melvia Heaps CNM scheduled for 12-16-15.

## 2015-11-05 ENCOUNTER — Other Ambulatory Visit: Payer: Self-pay | Admitting: Certified Nurse Midwife

## 2015-11-05 DIAGNOSIS — N760 Acute vaginitis: Principal | ICD-10-CM

## 2015-11-05 DIAGNOSIS — B9689 Other specified bacterial agents as the cause of diseases classified elsewhere: Secondary | ICD-10-CM

## 2015-11-05 LAB — WET PREP BY MOLECULAR PROBE
Candida species: NEGATIVE
Gardnerella vaginalis: POSITIVE — AB
Trichomonas vaginosis: NEGATIVE

## 2015-11-05 MED ORDER — METRONIDAZOLE 0.75 % VA GEL: 1.0000 | Freq: Two times a day (BID) | VAGINAL | 0 refills | Status: DC

## 2015-11-06 NOTE — Progress Notes (Signed)
Encounter reviewed Philo Kurtz, MD   

## 2015-11-09 ENCOUNTER — Ambulatory Visit
Admission: RE | Admit: 2015-11-09 | Discharge: 2015-11-09 | Disposition: A | Discharge status: Home / Self Care | Payer: 59 | Source: Ambulatory Visit | Attending: Certified Nurse Midwife | Admitting: Certified Nurse Midwife

## 2015-11-09 DIAGNOSIS — N6320 Unspecified lump in the left breast, unspecified quadrant: Secondary | ICD-10-CM

## 2015-11-09 DIAGNOSIS — N6325 Unspecified lump in the left breast, overlapping quadrants: Secondary | ICD-10-CM

## 2015-12-15 ENCOUNTER — Telehealth: Payer: Self-pay | Admitting: Certified Nurse Midwife

## 2015-12-15 NOTE — Telephone Encounter (Signed)
Please follow up with patient, even though lymph node noted, need to make sure this is the area we felt and no other area identified.

## 2015-12-15 NOTE — Telephone Encounter (Signed)
Spoke with patient. Advised of message as seen below from [place]. Advised of need for breast recheck in the office to ensure there are no further changes to the breast. Patient declines stating that she is not concerned and [facility]. Reports she has been monitoring the area and there have been no changes. Advised while we do encourage her to continue perform self breast exams it is important to have a provider re-evaluate the area. Patient again declines. Advised I will let Melvia Heaps CNM know she has declined this appointment. She is agreeable.

## 2015-12-15 NOTE — Telephone Encounter (Signed)
Patient called and left a message at lunch cancelling her appointment for a "6 week breast check" on 12/16/15. She said, "I am cancelling this because I don't see any need to come in."  Routing to provider for review.

## 2015-12-16 ENCOUNTER — Ambulatory Visit: Payer: 59 | Admitting: Certified Nurse Midwife

## 2015-12-19 NOTE — Telephone Encounter (Signed)
Ok to remove pt from recall.  She is aware of recommendation and has declined.

## 2015-12-21 NOTE — Telephone Encounter (Signed)
Patient has been removed from recall and mammogram hold.  Cc;Deborah Hollice Espy CNM  Routing to provider for final review. Patient agreeable to disposition. Will close encounter.

## 2016-11-04 ENCOUNTER — Ambulatory Visit: Payer: 59 | Admitting: Certified Nurse Midwife

## 2016-11-09 ENCOUNTER — Other Ambulatory Visit: Payer: Self-pay | Admitting: Certified Nurse Midwife

## 2016-11-09 ENCOUNTER — Encounter: Payer: Self-pay | Admitting: Certified Nurse Midwife

## 2016-11-09 ENCOUNTER — Other Ambulatory Visit (HOSPITAL_COMMUNITY)
Admission: RE | Admit: 2016-11-09 | Discharge: 2016-11-09 | Disposition: A | Discharge status: Home / Self Care | Payer: 59 | Source: Ambulatory Visit | Attending: Obstetrics & Gynecology | Admitting: Obstetrics & Gynecology

## 2016-11-09 ENCOUNTER — Ambulatory Visit (INDEPENDENT_AMBULATORY_CARE_PROVIDER_SITE_OTHER): Payer: 59 | Admitting: Certified Nurse Midwife

## 2016-11-09 VITALS — BP 102/60 | HR 70 | Resp 16 | Ht 63.75 in | Wt 131.0 lb

## 2016-11-09 DIAGNOSIS — Z124 Encounter for screening for malignant neoplasm of cervix: Secondary | ICD-10-CM | POA: Diagnosis not present

## 2016-11-09 DIAGNOSIS — Z01411 Encounter for gynecological examination (general) (routine) with abnormal findings: Secondary | ICD-10-CM | POA: Insufficient documentation

## 2016-11-09 DIAGNOSIS — Z0000 Encounter for general adult medical examination without abnormal findings: Secondary | ICD-10-CM | POA: Diagnosis not present

## 2016-11-09 DIAGNOSIS — Z01419 Encounter for gynecological examination (general) (routine) without abnormal findings: Secondary | ICD-10-CM

## 2016-11-09 NOTE — Patient Instructions (Signed)

## 2016-11-09 NOTE — Progress Notes (Signed)
36 y.o. G0P0000 Married  Caucasian Fe here for annual exam. Periods normal, no issues, some heavier days, but no problems. Continues with fishy odor after sexual activity at times. Some vaginal dryness. ? BV again. Seems to chronic at times and Metrogel does take odor away.  Would like to have discharge checked, some burning again.  Desires screening labs. No other health issues today. [place] retreat soon.  Patient's last menstrual period was 10/21/2016 (exact date).          Sexually active: Yes.    The current method of family planning is vasectomy.    Exercising: No.  exercise Smoker:  no  Health Maintenance: Pap:  10-31-14 neg HPV HR neg History of Abnormal Pap: no MMG:  11-09-15 bilateral & left breast category b density birads 1:neg Self Breast exams: no Colonoscopy:  none BMD:   none TDaP:  UTD Shingles: no Pneumonia: no Hep C and HIV: done at work Labs: done   reports that she has never smoked. She has never used smokeless tobacco. She reports that she does not drink alcohol or use drugs.  Past Medical History:  Diagnosis Date  . Abnormal uterine bleeding   . Allergy   . Anxiety   . Arthritis   . Asthma   . ASTHMA, EXERCISE INDUCED 04/09/2009  . GERD 04/09/2009  . GERD (gastroesophageal reflux disease)   . 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 WISDOM TOOTH EXTRACTION  Highschool    Current Outpatient Prescriptions  Medication Sig Dispense Refill  . Probiotic Product (PROBIOTIC PO) Take by mouth.     No current facility-administered medications for this visit.     Family History  Problem Relation Age of Onset  . Arthritis Mother   . Hyperlipidemia Mother   . Melanoma Mother   . Alcohol abuse Father        grandparents, aunt  . Arthritis Father        grandparents  . Hyperlipidemia Father        grandparents  . Breast cancer Unknown        breast/great-grandmother, aunt   . Crohn's disease Sister     ROS:  Pertinent items are noted in HPI.  Otherwise, a comprehensive ROS was negative.  Exam:   BP 102/60   Pulse 70   Resp 16   Ht 5' 3.75" (1.619 m)   Wt 131 lb (59.4 kg)   LMP 10/21/2016 (Exact Date)   BMI 22.66 kg/m  Height: 5' 3.75" (161.9 cm) Ht Readings from Last 3 Encounters:  11/09/16 5' 3.75" (1.619 m)  11/04/15 5' 3.5" (1.613 m)  10/31/14 5' 3.75" (1.619 m)    General appearance: alert, cooperative and appears stated age Head: Normocephalic, without obvious abnormality, atraumatic Neck: no adenopathy, supple, symmetrical, trachea midline and thyroid normal to inspection and palpation Lungs: clear to auscultation bilaterally Breasts: normal appearance, no masses or tenderness, No nipple retraction or dimpling, No nipple discharge or bleeding, No axillary or supraclavicular adenopathy Heart: regular rate and rhythm Abdomen: soft, non-tender; no masses,  no organomegaly Extremities: extremities normal, atraumatic, no cyanosis or edema Skin: Skin color, texture, turgor normal. No rashes or lesions Lymph nodes: Cervical, supraclavicular, and axillary nodes normal. No abnormal inguinal nodes palpated Neurologic: Grossly normal   Pelvic: External genitalia:  no lesions              Urethra:  normal appearing  urethra with no masses, tenderness or lesions              Bartholin's and Skene's: normal                 Vagina: normal appearing vagina with normal color and watery odorous discharge, no lesions wet prep taken              Cervix: no bleeding following Pap, no cervical motion tenderness and no lesions              Pap taken: Yes.   Bimanual Exam:  Uterus:  normal size, contour, position, consistency, mobility, non-tender              Adnexa: normal adnexa and no mass, fullness, tenderness               Rectovaginal: Confirms               Anus:  normal sphincter tone, no lesions  Chaperone present: yes  A:  Well Woman with normal  exam  Contraception spouse vasectomy  BV chronic?  Screening labs  P:   Reviewed health and wellness pertinent to exam  Discussed Boric acid capsule use for prevention of BV and help with control of odor. Patient would like to try.  Rx Metrogel with instructions written RX given  Rx Boric Acid capsules written Rx given with instructions  Patient will advise if no change  Labs: CBC,TSH,Lipid Panel, CMP, Vitamin D  Pap smear: yes  counseled on breast self exam, mammography screening, feminine hygiene, adequate intake of calcium and vitamin D, diet and exercise  return annually or prn  An After Visit Summary was printed and given to the patient.

## 2016-11-10 LAB — CBC WITH DIFFERENTIAL/PLATELET
Basophils Absolute: 0 10*3/uL (ref 0.0–0.2)
Basos: 1 %
EOS (ABSOLUTE): 0.1 10*3/uL (ref 0.0–0.4)
EOS: 2 %
HEMATOCRIT: 39.5 % (ref 34.0–46.6)
HEMOGLOBIN: 13.2 g/dL (ref 11.1–15.9)
IMMATURE GRANS (ABS): 0 10*3/uL (ref 0.0–0.1)
Immature Granulocytes: 0 %
LYMPHS ABS: 2.3 10*3/uL (ref 0.7–3.1)
LYMPHS: 37 %
MCH: 31.9 pg (ref 26.6–33.0)
MCHC: 33.4 g/dL (ref 31.5–35.7)
MCV: 95 fL (ref 79–97)
MONOCYTES: 9 %
Monocytes Absolute: 0.6 10*3/uL (ref 0.1–0.9)
NEUTROS ABS: 3.3 10*3/uL (ref 1.4–7.0)
Neutrophils: 51 %
Platelets: 269 10*3/uL (ref 150–379)
RBC: 4.14 x10E6/uL (ref 3.77–5.28)
RDW: 13 % (ref 12.3–15.4)
WBC: 6.3 10*3/uL (ref 3.4–10.8)

## 2016-11-10 LAB — LIPID PANEL W/O CHOL/HDL RATIO
Cholesterol, Total: 249 mg/dL — ABNORMAL HIGH (ref 100–199)
HDL: 88 mg/dL (ref >39)
LDL CALC: 149 mg/dL — AB (ref 0–99)
Triglycerides: 58 mg/dL (ref 0–149)
VLDL CHOLESTEROL CAL: 12 mg/dL (ref 5–40)

## 2016-11-10 LAB — COMPREHENSIVE METABOLIC PANEL
ALBUMIN: 4.3 g/dL (ref 3.5–5.5)
ALK PHOS: 41 IU/L (ref 39–117)
ALT: 9 IU/L (ref 0–32)
AST: 14 IU/L (ref 0–40)
Albumin/Globulin Ratio: 1.9 (ref 1.2–2.2)
BUN/Creatinine Ratio: 25 — ABNORMAL HIGH (ref 9–23)
BUN: 17 mg/dL (ref 6–20)
Bilirubin Total: 0.3 mg/dL (ref 0.0–1.2)
CO2: 24 mmol/L (ref 20–29)
CREATININE: 0.69 mg/dL (ref 0.57–1.00)
Calcium: 9 mg/dL (ref 8.7–10.2)
Chloride: 101 mmol/L (ref 96–106)
GFR calc Af Amer: 131 mL/min/{1.73_m2} (ref >59)
GFR, EST NON AFRICAN AMERICAN: 114 mL/min/{1.73_m2} (ref >59)
GLUCOSE: 77 mg/dL (ref 65–99)
Globulin, Total: 2.3 g/dL (ref 1.5–4.5)
Potassium: 4.2 mmol/L (ref 3.5–5.2)
SODIUM: 139 mmol/L (ref 134–144)
TOTAL PROTEIN: 6.6 g/dL (ref 6.0–8.5)

## 2016-11-10 LAB — VITAMIN D 25 HYDROXY (VIT D DEFICIENCY, FRACTURES): Vit D, 25-Hydroxy: 37.3 ng/mL (ref 30.0–100.0)

## 2016-11-10 LAB — TSH: TSH: 2.49 u[IU]/mL (ref 0.450–4.500)

## 2016-11-11 ENCOUNTER — Telehealth: Payer: Self-pay

## 2016-11-11 DIAGNOSIS — R899 Unspecified abnormal finding in specimens from other organs, systems and tissues: Secondary | ICD-10-CM

## 2016-11-11 NOTE — Telephone Encounter (Signed)
Pt notified of lab results & lab ordered.

## 2016-11-14 LAB — CYTOLOGY - PAP: DIAGNOSIS: NEGATIVE

## 2017-02-08 ENCOUNTER — Telehealth: Payer: Self-pay | Admitting: Certified Nurse Midwife

## 2017-02-08 NOTE — Telephone Encounter (Signed)
Patient canceled her upcoming lab appointment 02/10/17. Patient to call later to reschedule.

## 2017-02-10 ENCOUNTER — Other Ambulatory Visit: Payer: 59

## 2017-02-17 NOTE — Telephone Encounter (Signed)
Called patient to try to reschedule canceled lab. Patient does not wish to reschedule. Okay to close encounter?

## 2017-02-21 NOTE — Telephone Encounter (Signed)
Since she is aware of the concern Ok to close encounter

## 2017-09-22 ENCOUNTER — Encounter: Payer: Self-pay | Admitting: Family Medicine

## 2017-09-22 ENCOUNTER — Ambulatory Visit: Payer: 59 | Admitting: Family Medicine

## 2017-09-22 VITALS — BP 116/80 | HR 79 | Temp 98.5°F | Wt 139.4 lb

## 2017-09-22 DIAGNOSIS — J029 Acute pharyngitis, unspecified: Secondary | ICD-10-CM | POA: Diagnosis not present

## 2017-09-22 LAB — POCT RAPID STREP A (OFFICE): Rapid Strep A Screen: NEGATIVE

## 2017-09-22 MED ORDER — DOXYCYCLINE HYCLATE 100 MG PO TABS: 100.0000 mg | ORAL_TABLET | Freq: Two times a day (BID) | ORAL | 0 refills | Status: DC

## 2017-09-22 NOTE — Progress Notes (Signed)
Patient: Cathy Austin MRN: [id number] DOB: 1980/07/13 PCP: Orma Flaming, MD     Subjective:  Chief Complaint  Patient presents with  . Sore Throat    symptoms x2 days  . Headache    HPI: The patient is a 37 y.o. female who presents today for sore throat. Symptoms started a couple of weeks ago. Son has had similar symptoms. Around that time she had a horrible migraine and subjective fever. On Tuesday she had a horrible sore throat. Yesterday she started to have sinus pain and pressure and ear pain. She is blowing out a lot of stuff. No tooth pain and no foul smell. No cough or shortness of breath. No other sick contacts.   Review of Systems  Constitutional: Positive for fatigue. Negative for fever.  HENT: Positive for congestion, ear pain, sinus pressure, sinus pain and sore throat.   Respiratory: Positive for cough. Negative for shortness of breath.   Cardiovascular: Negative for chest pain.  Gastrointestinal: Negative for diarrhea, nausea and vomiting.  Neurological: Negative for dizziness and headaches.  Psychiatric/Behavioral: The patient is not nervous/anxious.     Allergies Patient is allergic to azithromycin; guaifenesin; and penicillins.  Past Medical History Patient  has a past medical history of Abnormal uterine bleeding, Allergy, Anxiety, Arthritis, Asthma, ASTHMA, EXERCISE INDUCED (04/09/2009), GERD (04/09/2009), GERD (gastroesophageal reflux disease), and STD (sexually transmitted disease) (2014).  Surgical History Patient  has a past surgical history that includes Wisdom tooth extraction (Highschool).  Family History Pateint's family history includes Alcohol abuse in her father; Arthritis in her father and mother; Breast cancer in her unknown relative; Crohn's disease in her sister; Hyperlipidemia in her father and mother; Melanoma in her mother.  Social History Patient  reports that she has never smoked. She has never used smokeless tobacco. She  reports that she does not drink alcohol or use drugs.    Objective: Vitals:   09/22/17 1420  BP: 116/80  Pulse: 79  Temp: 98.5 F (36.9 C)  TempSrc: Oral  SpO2: 99%  Weight: 139 lb 6.4 oz (63.2 kg)    Body mass index is 24.12 kg/m.  Physical Exam  Constitutional: She appears well-developed and well-nourished.  HENT:  Head: Normocephalic and atraumatic.  Right Ear: Tympanic membrane, external ear and ear canal normal.  Left Ear: Tympanic membrane, external ear and ear canal normal.  Nose: Nose normal.  Mouth/Throat: Oropharynx is clear and moist and mucous membranes are normal. No oropharyngeal exudate.  Eyes: Conjunctivae are normal.  Cardiovascular: Normal rate, regular rhythm and normal heart sounds.  Pulmonary/Chest: Effort normal and breath sounds normal. She has no wheezes. She has no rales.  Abdominal: Soft. Bowel sounds are normal.  Lymphadenopathy:    She has no cervical adenopathy.  Vitals reviewed.  Rapid strep: negative     Assessment/plan: 1. Viral pharyngitis Conservative therapy with rest, fluids, NSAIDs and cool mist humidifier. Discussed can last about 7 days. Declines steroid shot.  May be on verge of sinus infection so suggested flonase and cool mist humidifier. Sending in doxy to have incase sinus pressure gets worse or fever over weekend. Let us know if change or not getting better.  - POCT rapid strep A    Return if symptoms worsen or fail to improve.     Orma Flaming, MD [place]   09/22/2017

## 2017-11-10 ENCOUNTER — Ambulatory Visit: Payer: 59 | Admitting: Certified Nurse Midwife

## 2017-12-06 ENCOUNTER — Encounter: Payer: 59 | Admitting: Family Medicine

## 2018-01-30 IMAGING — MG 2D DIGITAL DIAGNOSTIC BILATERAL MAMMOGRAM WITH CAD AND ADJUNCT T
8 of 12 series · 8 of 28 positions shown · non-contrast
Comparison: None.

CLINICAL DATA: 34-year-old female presenting for evaluation of a
palpable area of concern in the lateral left breast identified by
the patient's physician.

EXAM:
2D DIGITAL DIAGNOSTIC LEFT MAMMOGRAM WITH CAD AND ADJUNCT TOMO
ULTRASOUND LEFT BREAST

[L CC]
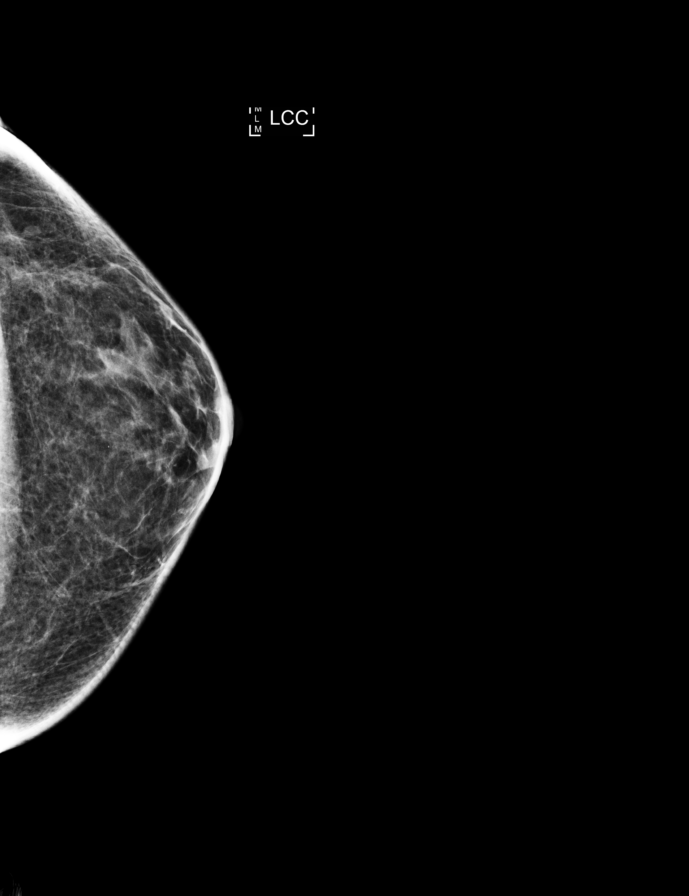

[R MLO]
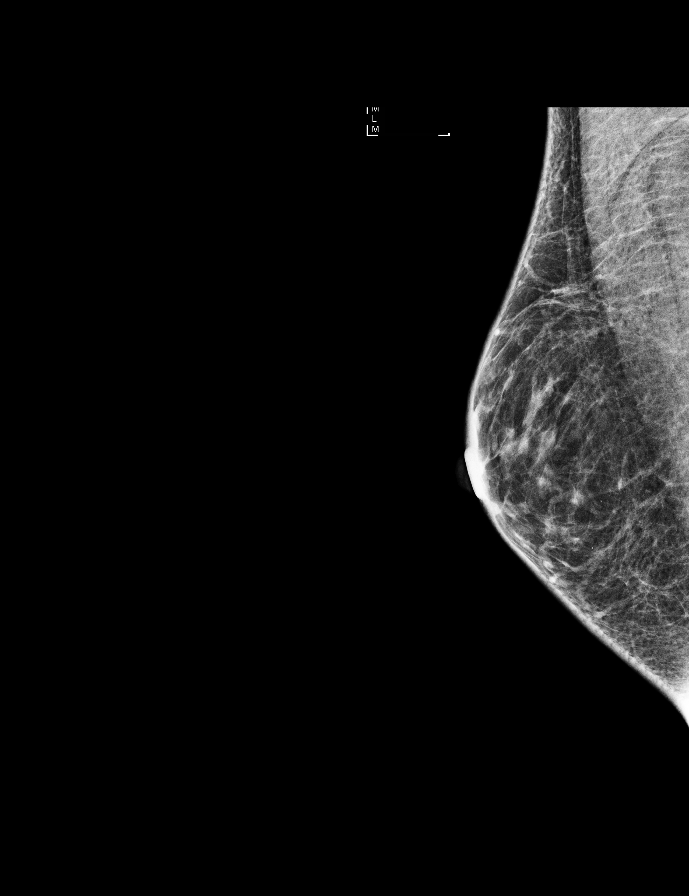

[R CC synth-2D]
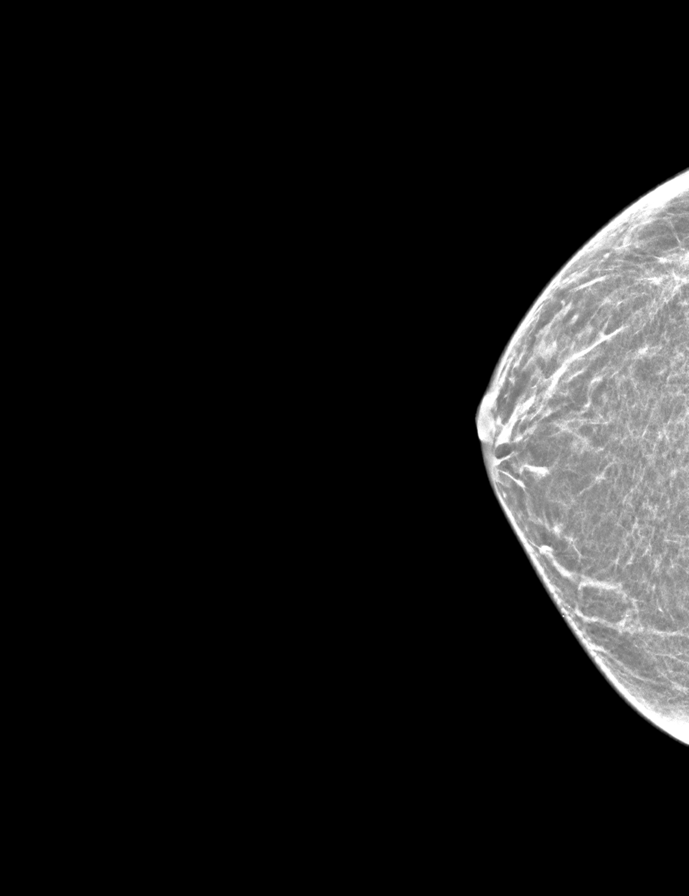

[L MLO synth-2D]
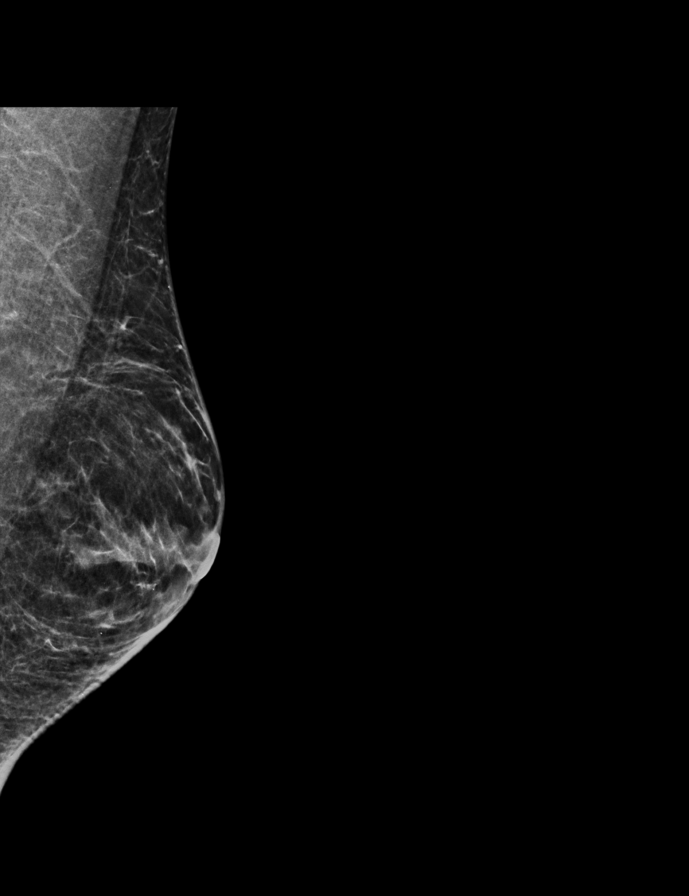

[R CC]
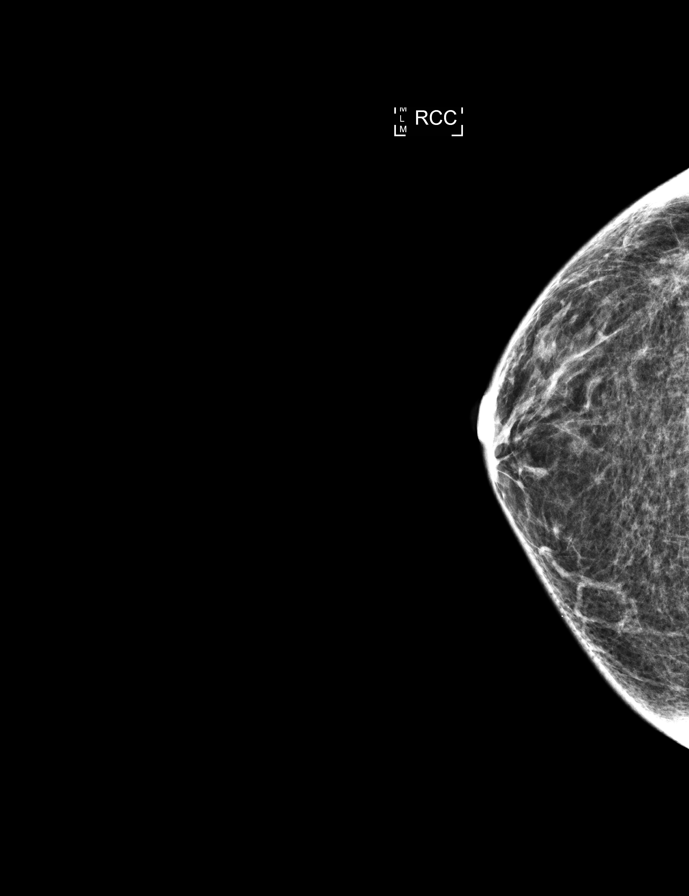

[L MLO]
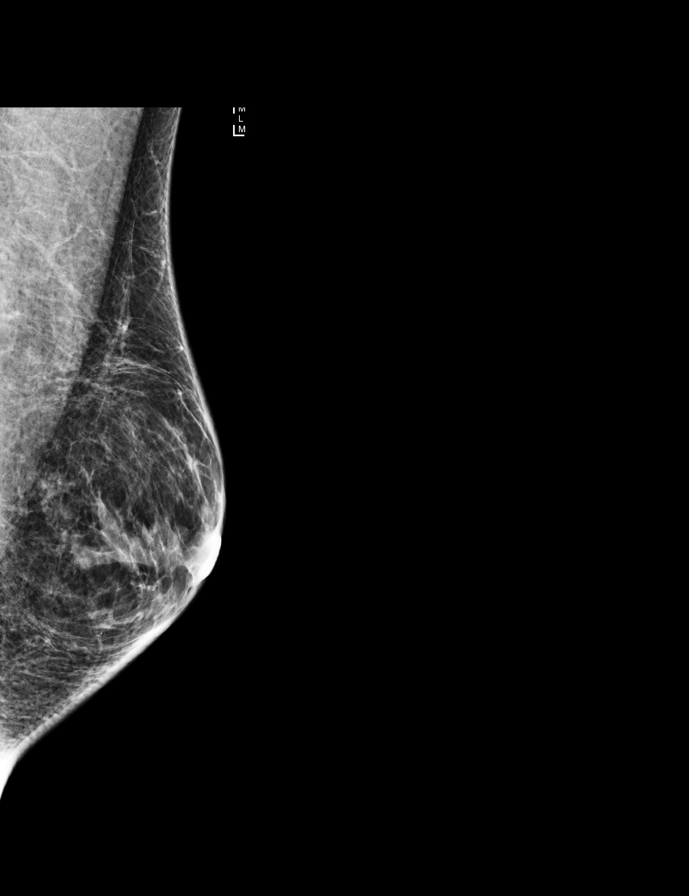

[R MLO synth-2D]
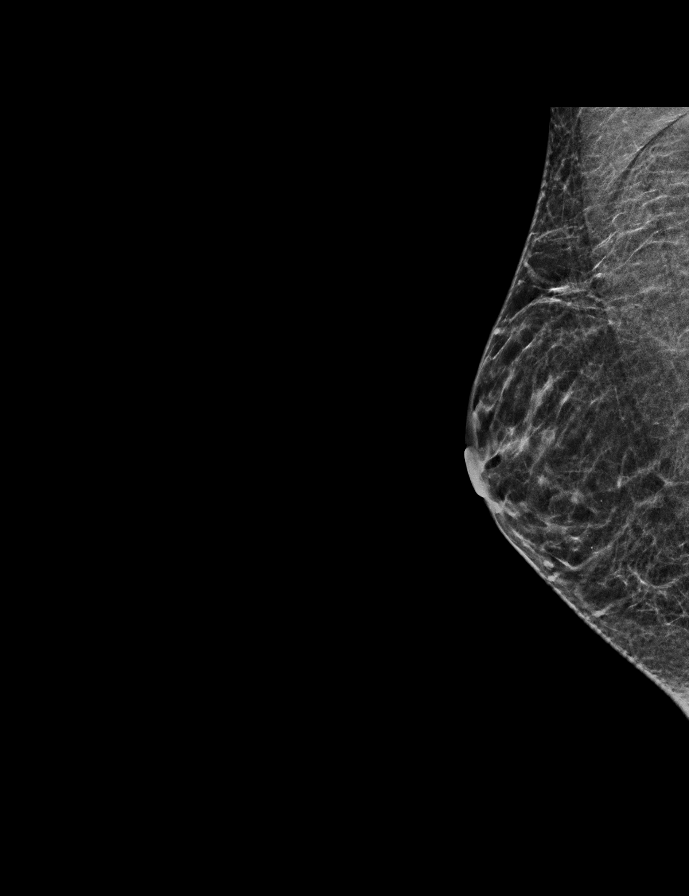

[L CC synth-2D]
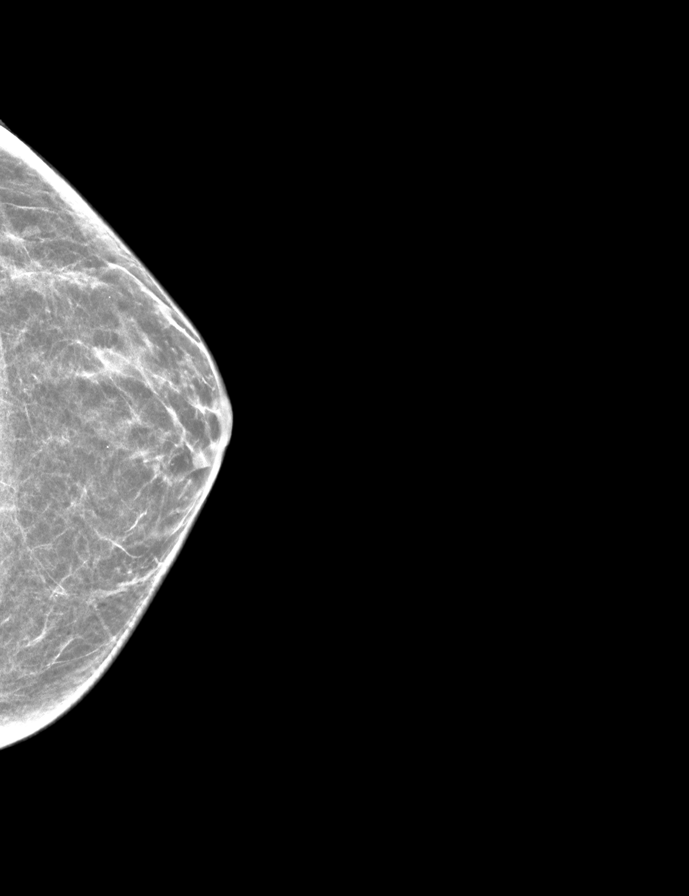

[8 of 28 positions shown; findings below may reference images not displayed]

ACR Breast Density Category b: There are scattered areas of
fibroglandular density.
FINDINGS: In the lateral aspect of the left breast, best seen on the CC view
there is a circumscribed oval mass measuring approximately 5 mm. No
other suspicious calcifications, masses or areas of distortion are
seen in the bilateral breasts.

Mammographic images were processed with CAD.

On physical exam, a firm ridge of tissue without discrete palpable
masses identified at the palpable site identified by the patient
adjacent to the nipple at 3 o'clock.

Targeted ultrasound is performed, showing no suspicious masses at 3
o'clock at the palpable site identified by the patient. In the left
breast at 2 o'clock, 4 cm from the nipple there is a 4 mm mass
consistent with a benign lymph node. This corresponds with the mass
identified mammographically.
IMPRESSION: 1. No mammographic or targeted sonographic correlate for the
palpable area of concern in the left breast.

2.  No mammographic evidence of malignancy in the bilateral breasts.

RECOMMENDATION:
1. Clinical follow-up recommended for the palpable area of concern
in the left breast. Any further workup should be based on clinical
grounds.

2. Screening mammogram at age 40 unless there are persistent or
intervening clinical concerns. (Code:[id number]-Q-[id number])

I have discussed the findings and recommendations with the patient.
Results were also provided in writing at the conclusion of the
visit. If applicable, a reminder letter will be sent to the patient
regarding the next appointment.

BI-RADS CATEGORY  1: Negative.

## 2018-03-21 ENCOUNTER — Encounter: Payer: 59 | Admitting: Family Medicine

## 2018-03-23 ENCOUNTER — Encounter: Payer: 59 | Admitting: Family Medicine

## 2019-02-06 ENCOUNTER — Ambulatory Visit (INDEPENDENT_AMBULATORY_CARE_PROVIDER_SITE_OTHER): Payer: 59 | Admitting: Family Medicine

## 2019-02-06 ENCOUNTER — Encounter: Payer: Self-pay | Admitting: Family Medicine

## 2019-02-06 ENCOUNTER — Telehealth: Payer: Self-pay

## 2019-02-06 VITALS — Ht 63.75 in | Wt 130.0 lb

## 2019-02-06 DIAGNOSIS — R21 Rash and other nonspecific skin eruption: Secondary | ICD-10-CM

## 2019-02-06 DIAGNOSIS — U071 COVID-19: Secondary | ICD-10-CM | POA: Diagnosis not present

## 2019-02-06 MED ORDER — HYDROXYZINE HCL 25 MG PO TABS: 25.0000 mg | ORAL_TABLET | Freq: Three times a day (TID) | ORAL | 0 refills | Status: DC | PRN

## 2019-02-06 NOTE — Telephone Encounter (Signed)
Patient returning call to [facility].

## 2019-02-06 NOTE — Progress Notes (Signed)
Patient: Cathy Austin MRN: [id number] DOB: 1980-10-09 PCP: Orma Flaming, MD     I connected with Cathy Austin on 02/06/19 at 2:45pm by a video enabled telemedicine application and verified that I am speaking with the correct person using two identifiers.  Location patient: Home Location provider: [facility], Office Persons participating in this virtual visit: Cathy Austin and Dr. Rogers Blocker   I discussed the limitations of evaluation and management by telemedicine and the availability of in person appointments. The patient expressed understanding and agreed to proceed.   Subjective:  Chief Complaint  Patient presents with  . Fatigue  . rash on extremeties    HPI: The patient is a 38 y.o. female who presents today for fatigue and rash. She was diagnosed with covid on 01/25/2019. She got sick on 01/19/19 She states she is doing okay, but symptoms come in waves. She states she is just so tired and feels short of breath. She also has a rash that started about one week ago. It is maculopapular in nature and extremely itchy. She thought it was poison ivy from her dog. She had hydrocortisone 2.5, but this does not help. Triamcinolone does not help. She had a steroid shot on 01/25/2019 and then oral prednisone 40mg  x 5 days. It healed her legs up, but she is still itching. She has not taken zyrtec/benadryl. She wonders if this is from the covid. She has been seen at her work from her doctor.  She has a mild cough and some sinus drainage. Her biggest complaint is the extreme fatigue. She tried to go back to work, but was unable to wear the lead and mask and be able to breath. She is needing STD from 01/19/2019-02/11/2019.   Review of Systems  Constitutional: Positive for fatigue. Negative for chills and fever.  HENT: Positive for postnasal drip. Negative for congestion, rhinorrhea and sore throat.   Eyes: Positive for photophobia. Negative for pain.  Respiratory: Positive  for cough. Negative for chest tightness and shortness of breath.   Cardiovascular: Positive for palpitations. Negative for chest pain and leg swelling.       C/o heaviness in chest  Gastrointestinal: Positive for abdominal pain, diarrhea and nausea. Negative for vomiting.  Musculoskeletal: Positive for myalgias. Negative for back pain, neck pain and neck stiffness.  Skin: Positive for rash.       Rash on  Forearms, legs. Very itchy Using triamcinolone/Sarna  Neurological: Positive for light-headedness. Negative for dizziness and headaches.  Psychiatric/Behavioral: Positive for sleep disturbance.    Allergies Patient is allergic to azithromycin; guaifenesin; and penicillins.  Past Medical History Patient  has a past medical history of Abnormal uterine bleeding, Allergy, Anxiety, Arthritis, Asthma, ASTHMA, EXERCISE INDUCED (04/09/2009), GERD (04/09/2009), GERD (gastroesophageal reflux disease), and STD (sexually transmitted disease) (2014).  Surgical History Patient  has a past surgical history that includes Wisdom tooth extraction (Highschool).  Family History Pateint's family history includes Alcohol abuse in her father; Arthritis in her father and mother; Breast cancer in her unknown relative; Crohn's disease in her sister; Hyperlipidemia in her father and mother; Melanoma in her mother.  Social History Patient  reports that she has never smoked. She has never used smokeless tobacco. She reports that she does not drink alcohol or use drugs.    Objective: Vitals:   02/06/19 1105  Weight: 130 lb (59 kg)  Height: 5' 3.75" (1.619 m)    Body mass index is 22.49 kg/m.  Physical Exam Vitals signs reviewed.  Constitutional:  General: She is not in acute distress.    Appearance: Normal appearance. She is normal weight. She is not ill-appearing.  HENT:     Head: Normocephalic and atraumatic.  Pulmonary:     Effort: Pulmonary effort is normal.  Skin:    Comments: Poorly  visualized, but appears to maculopapular rash on left lateral humerus.   Neurological:     General: No focal deficit present.     Mental Status: She is alert and oriented to person, place, and time.        Assessment/plan: 1. COVID-19 She is still having fatigue symptoms, myalgias and can not work with the [place]. Continue conservative therapy and will do her STD paperwork for her. Discussed there is a condition called covid long haulers where symptoms linger for prolonged periods of time. Will see how she improves.   2. Rash Likely secondary to covid. Want her to do the steroid cream twice a day and then zyrtec daily and will do hydroxyzine as well. If not getting better she can let me or her dermatologist know. Likely will clear with time if related to covid.   -STD will be filled out.   No follow-ups on file.  Records requested if needed. Time spent with patient: 25 minutes, of which >50% was spent in obtaining information about her symptoms, reviweing her previous labs, evaluations, and treatments, counseling her about her conditions (please see discussed topics above), and developing a plan to further investigate it; she had a number of questions which I addressed.    Orma Flaming, MD [place]  02/06/2019

## 2019-02-06 NOTE — Telephone Encounter (Signed)
Left detailed vm for patient asking if she would be available to do her appointment earlier today (originally scheduled for 2:40 today) Requested a c/b from patient.

## 2019-04-24 ENCOUNTER — Ambulatory Visit: Payer: 59 | Admitting: Family Medicine

## 2019-06-05 ENCOUNTER — Ambulatory Visit: Payer: 59 | Admitting: Family Medicine

## 2019-06-21 ENCOUNTER — Encounter: Payer: Self-pay | Admitting: Certified Nurse Midwife

## 2019-07-24 ENCOUNTER — Ambulatory Visit (INDEPENDENT_AMBULATORY_CARE_PROVIDER_SITE_OTHER): Payer: 59 | Admitting: Family Medicine

## 2019-07-24 ENCOUNTER — Other Ambulatory Visit (HOSPITAL_COMMUNITY)
Admission: RE | Admit: 2019-07-24 | Discharge: 2019-07-24 | Disposition: A | Discharge status: Home / Self Care | Payer: 59 | Source: Ambulatory Visit | Attending: Family Medicine | Admitting: Family Medicine

## 2019-07-24 ENCOUNTER — Other Ambulatory Visit: Payer: Self-pay

## 2019-07-24 ENCOUNTER — Encounter: Payer: Self-pay | Admitting: Family Medicine

## 2019-07-24 ENCOUNTER — Other Ambulatory Visit: Payer: Self-pay | Admitting: Family Medicine

## 2019-07-24 VITALS — BP 116/70 | HR 95 | Temp 98.0°F | Ht 64.0 in | Wt 134.2 lb

## 2019-07-24 DIAGNOSIS — N926 Irregular menstruation, unspecified: Secondary | ICD-10-CM

## 2019-07-24 DIAGNOSIS — Z114 Encounter for screening for human immunodeficiency virus [HIV]: Secondary | ICD-10-CM

## 2019-07-24 DIAGNOSIS — Z01419 Encounter for gynecological examination (general) (routine) without abnormal findings: Secondary | ICD-10-CM | POA: Diagnosis not present

## 2019-07-24 DIAGNOSIS — Z0001 Encounter for general adult medical examination with abnormal findings: Secondary | ICD-10-CM | POA: Diagnosis not present

## 2019-07-24 DIAGNOSIS — R350 Frequency of micturition: Secondary | ICD-10-CM

## 2019-07-24 DIAGNOSIS — K589 Irritable bowel syndrome without diarrhea: Secondary | ICD-10-CM | POA: Insufficient documentation

## 2019-07-24 DIAGNOSIS — Z0000 Encounter for general adult medical examination without abnormal findings: Secondary | ICD-10-CM

## 2019-07-24 DIAGNOSIS — Z23 Encounter for immunization: Secondary | ICD-10-CM

## 2019-07-24 LAB — CBC WITH DIFFERENTIAL/PLATELET
Basophils Absolute: 0 10*3/uL (ref 0.0–0.1)
Basophils Relative: 1.2 % (ref 0.0–3.0)
Eosinophils Absolute: 0 10*3/uL (ref 0.0–0.7)
Eosinophils Relative: 1 % (ref 0.0–5.0)
HCT: 40.4 % (ref 36.0–46.0)
Hemoglobin: 13.7 g/dL (ref 12.0–15.0)
Lymphocytes Relative: 42.3 % (ref 12.0–46.0)
Lymphs Abs: 1.8 10*3/uL (ref 0.7–4.0)
MCHC: 34 g/dL (ref 30.0–36.0)
MCV: 95.4 fl (ref 78.0–100.0)
Monocytes Absolute: 0.4 10*3/uL (ref 0.1–1.0)
Monocytes Relative: 8.9 % (ref 3.0–12.0)
Neutro Abs: 2 10*3/uL (ref 1.4–7.7)
Neutrophils Relative %: 46.6 % (ref 43.0–77.0)
Platelets: 278 10*3/uL (ref 150.0–400.0)
RBC: 4.23 Mil/uL (ref 3.87–5.11)
RDW: 13 % (ref 11.5–15.5)
WBC: 4.2 10*3/uL (ref 4.0–10.5)

## 2019-07-24 LAB — COMPREHENSIVE METABOLIC PANEL
ALT: 16 U/L (ref 0–35)
AST: 20 U/L (ref 0–37)
Albumin: 4.4 g/dL (ref 3.5–5.2)
Alkaline Phosphatase: 39 U/L (ref 39–117)
BUN: 14 mg/dL (ref 6–23)
CO2: 27 mEq/L (ref 19–32)
Calcium: 9 mg/dL (ref 8.4–10.5)
Chloride: 103 mEq/L (ref 96–112)
Creatinine, Ser: 0.69 mg/dL (ref 0.40–1.20)
GFR: 95.39 mL/min (ref >60.00)
Glucose, Bld: 82 mg/dL (ref 70–99)
Potassium: 4.1 mEq/L (ref 3.5–5.1)
Sodium: 138 mEq/L (ref 135–145)
Total Bilirubin: 0.7 mg/dL (ref 0.2–1.2)
Total Protein: 6.8 g/dL (ref 6.0–8.3)

## 2019-07-24 LAB — URINALYSIS, ROUTINE W REFLEX MICROSCOPIC
Bilirubin Urine: NEGATIVE
Hgb urine dipstick: NEGATIVE
Leukocytes,Ua: NEGATIVE
Nitrite: NEGATIVE
RBC / HPF: NONE SEEN (ref 0–?)
Specific Gravity, Urine: 1.025 (ref 1.000–1.030)
Total Protein, Urine: NEGATIVE
Urine Glucose: NEGATIVE
Urobilinogen, UA: 0.2 (ref 0.0–1.0)
pH: 6 (ref 5.0–8.0)

## 2019-07-24 LAB — LIPID PANEL
Cholesterol: 203 mg/dL — ABNORMAL HIGH (ref 0–200)
HDL: 83.2 mg/dL (ref >39.00)
LDL Cholesterol: 111 mg/dL — ABNORMAL HIGH (ref 0–99)
NonHDL: 119.32
Total CHOL/HDL Ratio: 2
Triglycerides: 43 mg/dL (ref 0.0–149.0)
VLDL: 8.6 mg/dL (ref 0.0–40.0)

## 2019-07-24 LAB — VITAMIN D 25 HYDROXY (VIT D DEFICIENCY, FRACTURES): VITD: 31.26 ng/mL (ref 30.00–100.00)

## 2019-07-24 LAB — TSH: TSH: 2.96 u[IU]/mL (ref 0.35–4.50)

## 2019-07-24 NOTE — Patient Instructions (Signed)
tdap today and labs Pap smear Urine and culture For irregular periods: getting ultrasound. They will call you to set this up!  Stay the course! Doing well- Dr. Rogers Blocker     Preventive Care 4-38 Years Old, Female Preventive care refers to visits with your health care provider and lifestyle choices that can promote health and wellness. This includes:  A yearly physical exam. This may also be called an annual well check.  Regular dental visits and eye exams.  Immunizations.  Screening for certain conditions.  Healthy lifestyle choices, such as eating a healthy diet, getting regular exercise, not using drugs or products that contain nicotine and tobacco, and limiting alcohol use. What can I expect for my preventive care visit? Physical exam Your health care provider will check your:  Height and weight. This may be used to calculate body mass index (BMI), which tells if you are at a healthy weight.  Heart rate and blood pressure.  Skin for abnormal spots. Counseling Your health care provider may ask you questions about your:  Alcohol, tobacco, and drug use.  Emotional well-being.  Home and relationship well-being.  Sexual activity.  Eating habits.  Work and work Statistician.  Method of birth control.  Menstrual cycle.  Pregnancy history. What immunizations do I need?  Influenza (flu) vaccine  This is recommended every year. Tetanus, diphtheria, and pertussis (Tdap) vaccine  You may need a Td booster every 10 years. Varicella (chickenpox) vaccine  You may need this if you have not been vaccinated. Human papillomavirus (HPV) vaccine  If recommended by your health care provider, you may need three doses over 6 months. Measles, mumps, and rubella (MMR) vaccine  You may need at least one dose of MMR. You may also need a second dose. Meningococcal conjugate (MenACWY) vaccine  One dose is recommended if you are age 37-21 years and a first-year college student  living in a residence hall, or if you have one of several medical conditions. You may also need additional booster doses. Pneumococcal conjugate (PCV13) vaccine  You may need this if you have certain conditions and were not previously vaccinated. Pneumococcal polysaccharide (PPSV23) vaccine  You may need one or two doses if you smoke cigarettes or if you have certain conditions. Hepatitis A vaccine  You may need this if you have certain conditions or if you travel or work in places where you may be exposed to hepatitis A. Hepatitis B vaccine  You may need this if you have certain conditions or if you travel or work in places where you may be exposed to hepatitis B. Haemophilus influenzae type b (Hib) vaccine  You may need this if you have certain conditions. You may receive vaccines as individual doses or as more than one vaccine together in one shot (combination vaccines). Talk with your health care provider about the risks and benefits of combination vaccines. What tests do I need?  Blood tests  Lipid and cholesterol levels. These may be checked every 5 years starting at age 38.  Hepatitis C test.  Hepatitis B test. Screening  Diabetes screening. This is done by checking your blood sugar (glucose) after you have not eaten for a while (fasting).  Sexually transmitted disease (STD) testing.  BRCA-related cancer screening. This may be done if you have a family history of breast, ovarian, tubal, or peritoneal cancers.  Pelvic exam and Pap test. This may be done every 3 years starting at age 26. Starting at age 4, this may be done every  5 years if you have a Pap test in combination with an HPV test. Talk with your health care provider about your test results, treatment options, and if necessary, the need for more tests. Follow these instructions at home: Eating and drinking   Eat a diet that includes fresh fruits and vegetables, whole grains, lean protein, and low-fat  dairy.  Take vitamin and mineral supplements as recommended by your health care provider.  Do not drink alcohol if: ? Your health care provider tells you not to drink. ? You are pregnant, may be pregnant, or are planning to become pregnant.  If you drink alcohol: ? Limit how much you have to 0-1 drink a day. ? Be aware of how much alcohol is in your drink. In the [place] one drink equals one 12 oz bottle of beer (355 mL), one 5 oz glass of wine (148 mL), or one 1 oz glass of hard liquor (44 mL). Lifestyle  Take daily care of your teeth and gums.  Stay active. Exercise for at least 30 minutes on 5 or more days each week.  Do not use any products that contain nicotine or tobacco, such as cigarettes, e-cigarettes, and chewing tobacco. If you need help quitting, ask your health care provider.  If you are sexually active, practice safe sex. Use a condom or other form of birth control (contraception) in order to prevent pregnancy and STIs (sexually transmitted infections). If you plan to become pregnant, see your health care provider for a preconception visit. What's next?  Visit your health care provider once a year for a well check visit.  Ask your health care provider how often you should have your eyes and teeth checked.  Stay up to date on all vaccines. This information is not intended to replace advice given to you by your health care provider. Make sure you discuss any questions you have with your health care provider. Document Revised: 11/30/2017 Document Reviewed: 11/30/2017 Elsevier Patient Education  2020 [employer].

## 2019-07-24 NOTE — Progress Notes (Signed)
Patient: Cathy Austin MRN: [id number] DOB: 01/06/1981 PCP: Orma Flaming, MD     Subjective:  Chief Complaint  Patient presents with  . Annual Exam  . well woman  . Menstrual Problem  . Urinary Frequency    HPI: The patient is a 39 y.o. female who presents today for annual exam. She denies any changes to past medical history. There have been no recent hospitalizations. They are following a well balanced diet and exercise plan. Weight has been stable.   No family history of colon cancer or breast cancer in first degree relative.   She has cramping after she urinates. She did a UA and thinks it was infected and took a course of cipro. Still having cramping. Feels like her uterus is hard. No CVA tenderness, dysuria, urgency. No fever/chills.    Well woman -current birth control: vasectomy. She is a G0P0. Has a step son. She has no breast complaints. No family hx of breast cancer in first degree relative.  Menarch: age 5. Periods have also been irregular. She states there was a time they thought she had PCOS. She had childhood obesity. 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 Hx of abnormal pap smear in 2015 with LEEP and subsequent negative pap smears. Occasionally pain with sex. Sometimes will have vaginal discharge. Sexually active with her husband only.   Irregular periods: started 3 months ago and she is having cycles every 3 weeks. They are not heavy. Last for 3 days.   Immunization History  Administered Date(s) Administered  . Influenza Split 02/03/2012  . Influenza Whole 01/03/2019  . Tdap 07/24/2019   Colonoscopy:roputine screening  Mammogram: routine screening  Pap smear: she last had 11/2016 and wnl. Hx of abnormal pap in 2015 with colposcopy. She thinks she had a leep done after this. Normal pap smears after this.    Review of Systems  Constitutional: Negative for chills, fatigue and fever.  HENT: Negative for dental problem, ear pain, hearing loss and trouble  swallowing.   Eyes: Negative for visual disturbance.  Respiratory: Negative for cough, chest tightness and shortness of breath.   Cardiovascular: Negative for chest pain, palpitations and leg swelling.  Gastrointestinal: Negative for abdominal pain, blood in stool, diarrhea and nausea.  Endocrine: Negative for cold intolerance, polydipsia, polyphagia and polyuria.  Genitourinary: Positive for frequency, menstrual problem and pelvic pain. Negative for difficulty urinating, dysuria, flank pain, hematuria, urgency, vaginal discharge and vaginal pain.       Patient mentioned that she has some cramping with urination.   Musculoskeletal: Negative for arthralgias.  Skin: Negative for rash.  Neurological: Negative for dizziness and headaches.  Psychiatric/Behavioral: Negative for dysphoric mood and sleep disturbance. The patient is not nervous/anxious.     Allergies Patient is allergic to azithromycin; guaifenesin; other; and penicillins.  Past Medical History Patient  has a past medical history of Abnormal uterine bleeding, Allergy, Anxiety, Arthritis, Asthma, ASTHMA, EXERCISE INDUCED (04/09/2009), GERD (04/09/2009), GERD (gastroesophageal reflux disease), and STD (sexually transmitted disease) (2014).  Surgical History Patient  has a past surgical history that includes Wisdom tooth extraction (Highschool).  Family History Pateint's family history includes Alcohol abuse in her father; Arthritis in her father and mother; Breast cancer in her unknown relative; Crohn's disease in her sister; Hyperlipidemia in her father and mother; Melanoma in her mother.  Social History Patient  reports that she has never smoked. She has never used smokeless tobacco. She reports that she does not drink alcohol or use drugs.  Objective: Vitals:   07/24/19 0814  BP: 116/70  Pulse: 95  Temp: 98 F (36.7 C)  TempSrc: Temporal  SpO2: 97%  Weight: 134 lb 3.2 oz (60.9 kg)  Height: 5\' 4"  (1.626 m)    Body mass  index is 23.04 kg/m.  Physical Exam Vitals reviewed. Exam conducted with a chaperone present.  Constitutional:      Appearance: Normal appearance. She is well-developed and normal weight.  HENT:     Head: Normocephalic and atraumatic.     Right Ear: Tympanic membrane, ear canal and external ear normal.     Left Ear: Tympanic membrane, ear canal and external ear normal.     Mouth/Throat:     Mouth: Mucous membranes are moist.  Eyes:     Extraocular Movements: Extraocular movements intact.     Conjunctiva/sclera: Conjunctivae normal.     Pupils: Pupils are equal, round, and reactive to light.  Neck:     Thyroid: No thyromegaly.  Cardiovascular:     Rate and Rhythm: Normal rate and regular rhythm.     Heart sounds: Normal heart sounds. No murmur.  Pulmonary:     Effort: Pulmonary effort is normal.     Breath sounds: Normal breath sounds.  Abdominal:     General: Abdomen is flat. Bowel sounds are normal. There is no distension.     Palpations: Abdomen is soft.     Tenderness: There is no abdominal tenderness.  Genitourinary:    General: Normal vulva.     Vagina: Normal.     Cervix: Friability and cervical bleeding present. No cervical motion tenderness, discharge, lesion, erythema or eversion.     Uterus: Tender.      Adnexa: Right adnexa normal and left adnexa normal.     Comments: Uterus firm on palpation  Musculoskeletal:        General: Normal range of motion.     Cervical back: Normal range of motion and neck supple.     Left lower leg: Left lower leg edema:    Lymphadenopathy:     Cervical: No cervical adenopathy.  Skin:    General: Skin is warm and dry.     Capillary Refill: Capillary refill takes less than 2 seconds.     Findings: No rash.  Neurological:     General: No focal deficit present.     Mental Status: She is alert and oriented to person, place, and time.     Cranial Nerves: No cranial nerve deficit.     Coordination: Coordination normal.     Deep  Tendon Reflexes: Reflexes normal.  Psychiatric:        Mood and Affect: Mood normal.        Behavior: Behavior normal.          Office Visit from 07/24/2019 in [facility]  PHQ-2 Total Score  0      Assessment/plan: 1. Annual physical exam Routine fasting labs. HM addressed. She is very active and healthy. Continue healthy lifestyle. Pap smear today as well. F/u in one year or as needed.  Patient counseling [x]    Nutrition: Stressed importance of moderation in sodium/caffeine intake, saturated fat and cholesterol, caloric balance, sufficient intake of fresh fruits, vegetables, fiber, calcium, iron, and 1 mg of folate supplement per day (for females capable of pregnancy).  [x]    Stressed the importance of regular exercise.   []    Substance Abuse: Discussed cessation/primary prevention of tobacco, alcohol, or other drug use; driving or other  dangerous activities under the influence; availability of treatment for abuse.   [x]    Injury prevention: Discussed safety belts, safety helmets, smoke detector, smoking near bedding or upholstery.   [x]    Sexuality: Discussed sexually transmitted diseases, partner selection, use of condoms, avoidance of unintended pregnancy  and contraceptive alternatives.  [x]    Dental health: Discussed importance of regular tooth brushing, flossing, and dental visits.  [x]    Health maintenance and immunizations reviewed. Please refer to Health maintenance section.    - CBC with Differential/Platelet - Comprehensive metabolic panel - Lipid panel - TSH - VITAMIN D 25 Hydroxy (Vit-D Deficiency, Fractures)  2. Well woman exam with routine gynecological exam Pap and HPV. Overdue for this it seems. Declines any STD testing.  - Cytology - PAP( [facility])  3. Urinary frequency Will repeat UA/culture. No other red flags or suspicion for UTI.  - Urinalysis, Routine w reflex microscopic - Urine Culture  4. Irregular periods Labs and  ultrasound. Uterus firm on palpation. ? If she has fibroids with irregular periods. Will get labs back, ultrasound and go from there.  5. Encounter for screening for HIV  - HIV Antibody (routine testing w rflx)  6. Need for Tdap vaccination  - Tdap vaccine greater than or equal to 7yo IM  This visit occurred during the SARS-CoV-2 public health emergency.  Safety protocols were in place, including screening questions prior to the visit, additional usage of staff PPE, and extensive cleaning of exam room while observing appropriate contact time as indicated for disinfecting solutions.     Return if symptoms worsen or fail to improve.     Orma Flaming, MD [place]  07/24/2019

## 2019-07-25 LAB — HIV ANTIBODY (ROUTINE TESTING W REFLEX): HIV 1&2 Ab, 4th Generation: NONREACTIVE

## 2019-07-25 LAB — URINE CULTURE
MICRO NUMBER:: 10389827
Result:: NO GROWTH
SPECIMEN QUALITY:: ADEQUATE

## 2019-07-26 ENCOUNTER — Encounter: Payer: Self-pay | Admitting: Family Medicine

## 2019-07-26 ENCOUNTER — Other Ambulatory Visit: Payer: Self-pay | Admitting: Family Medicine

## 2019-07-26 DIAGNOSIS — R87810 Cervical high risk human papillomavirus (HPV) DNA test positive: Secondary | ICD-10-CM

## 2019-07-26 DIAGNOSIS — R87610 Atypical squamous cells of undetermined significance on cytologic smear of cervix (ASC-US): Secondary | ICD-10-CM

## 2019-07-26 LAB — CYTOLOGY - PAP
Comment: NEGATIVE
Diagnosis: UNDETERMINED — AB
High risk HPV: POSITIVE — AB

## 2019-07-26 NOTE — Progress Notes (Signed)
g

## 2019-07-29 ENCOUNTER — Telehealth: Payer: Self-pay

## 2019-07-29 NOTE — Telephone Encounter (Signed)
Opened in error

## 2019-07-31 ENCOUNTER — Ambulatory Visit
Admission: RE | Admit: 2019-07-31 | Discharge: 2019-07-31 | Disposition: A | Discharge status: Home / Self Care | Payer: 59 | Source: Ambulatory Visit | Attending: Family Medicine | Admitting: Family Medicine

## 2019-07-31 DIAGNOSIS — N926 Irregular menstruation, unspecified: Secondary | ICD-10-CM

## 2019-08-14 ENCOUNTER — Other Ambulatory Visit: Payer: Self-pay

## 2019-08-15 ENCOUNTER — Other Ambulatory Visit (HOSPITAL_COMMUNITY)
Admission: RE | Admit: 2019-08-15 | Discharge: 2019-08-15 | Disposition: A | Discharge status: Home / Self Care | Payer: 59 | Source: Ambulatory Visit | Attending: Obstetrics and Gynecology | Admitting: Obstetrics and Gynecology

## 2019-08-15 ENCOUNTER — Encounter: Payer: Self-pay | Admitting: Obstetrics and Gynecology

## 2019-08-15 ENCOUNTER — Ambulatory Visit: Payer: 59 | Admitting: Obstetrics and Gynecology

## 2019-08-15 VITALS — BP 112/70 | HR 95 | Temp 97.2°F | Ht 63.75 in | Wt 135.0 lb

## 2019-08-15 DIAGNOSIS — B977 Papillomavirus as the cause of diseases classified elsewhere: Secondary | ICD-10-CM | POA: Diagnosis present

## 2019-08-15 DIAGNOSIS — F5231 Female orgasmic disorder: Secondary | ICD-10-CM

## 2019-08-15 DIAGNOSIS — D259 Leiomyoma of uterus, unspecified: Secondary | ICD-10-CM

## 2019-08-15 DIAGNOSIS — R6882 Decreased libido: Secondary | ICD-10-CM

## 2019-08-15 DIAGNOSIS — R87610 Atypical squamous cells of undetermined significance on cytologic smear of cervix (ASC-US): Secondary | ICD-10-CM | POA: Diagnosis not present

## 2019-08-15 DIAGNOSIS — Z01812 Encounter for preprocedural laboratory examination: Secondary | ICD-10-CM

## 2019-08-15 DIAGNOSIS — IMO0002 Reserved for concepts with insufficient information to code with codable children: Secondary | ICD-10-CM

## 2019-08-15 LAB — POCT URINE PREGNANCY: Preg Test, Ur: NEGATIVE

## 2019-08-15 NOTE — Patient Instructions (Signed)

## 2019-08-15 NOTE — Progress Notes (Signed)
GYNECOLOGY  VISIT   HPI: 39 y.o.   Married   Other or two or more races White or Caucasian Hispanic or Latino  female   G0P0000 with Patient's last menstrual period was 08/04/2019.   here for a consultation from Dr Rogers Blocker for an abnormal pap and new diagnosis of a fibroid uterus.    Pap from 07/24/19 ASCUS, +HPV.  Prior h/o HPV/colposcopy in ~2015, f/u paps were normal. Never had any surgery on her cervix (not sure). Same partner since 2015.   Cycles are every 3-4 weeks (3 weeks is new) x 2 days then spots x 2 days. She can saturate a pad in 2 hours. Normal TSH, CBC in 4/21. She noticed a mass in her belly a few months ago, frequent urination, she feels constant pressure/discomfort, worse with her cycles. Some recent deep dyspareunia for the last few months, some improvement with certain positions.  No libido currently. Getting harder to orgasm.   Doesn't want children.   She was obese from [age], didn't used to have cycles, just started in the last few years.     ULTRASOUND from 07/31/19: CLINICAL DATA:  Patient with irregular menstrual cycle.  EXAM: TRANSABDOMINAL AND TRANSVAGINAL ULTRASOUND OF PELVIS  TECHNIQUE: Both transabdominal and transvaginal ultrasound examinations of the pelvis were performed. Transabdominal technique was performed for global imaging of the pelvis including uterus, ovaries, adnexal regions, and pelvic cul-de-sac. It was necessary to proceed with endovaginal exam following the transabdominal exam to visualize the endometrium.  COMPARISON:  None  FINDINGS: Uterus  Measurements: 16.2 x 9.8 x 10.9 cm = volume: 903 mL. There is a 9.7 x 2.7 x 9.7 cm large fibroid involving the superior slightly rightward aspect of the uterine fundus.  Endometrium  Thickness: 3 mm.  No focal abnormality visualized.  Right ovary  Measurements: 2.2 x 1.7 x 2.6 cm = volume: 3.0 mL. There is a 1.1 x 1.7 x 1.0 cm paraovarian cyst.  Left ovary  Not  visualized.  Other findings  No abnormal free fluid.  IMPRESSION: Endometrium measures 3 mm. If bleeding remains unresponsive to hormonal or medical therapy, sonohysterogram should be considered for focal lesion work-up. (Ref: Radiological Reasoning: Algorithmic Workup of Abnormal Vaginal Bleeding with Endovaginal Sonography and Sonohysterography. [id number])   Electronically Signed   By: Lovey Newcomer M.D.   On: 07/31/2019 14:18  GYNECOLOGIC HISTORY: Patient's last menstrual period was 08/04/2019. Contraception: Vasectomy Menopausal hormone therapy: none        OB History    Gravida  0   Para  0   Term  0   Preterm  0   AB  0   Living  0     SAB  0   TAB  0   Ectopic  0   Multiple  0   Live Births                 Patient Active Problem List   Diagnosis Date Noted  . IBS (irritable bowel syndrome) 07/24/2019  . ASTHMA, EXERCISE INDUCED 04/09/2009  . GERD 04/09/2009    Past Medical History:  Diagnosis Date  . Abnormal uterine bleeding   . Allergy   . Anxiety   . Arthritis   . Asthma   . ASTHMA, EXERCISE INDUCED 04/09/2009  . GERD 04/09/2009  . GERD (gastroesophageal reflux disease)   . 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 COLPOSCOPY    .  WISDOM TOOTH EXTRACTION  Highschool    Current Outpatient Medications  Medication Sig Dispense Refill  . camphor-menthol (SARNA) lotion Apply 1 application topically as needed for itching.    Marland Kitchen doxycycline (ADOXA) 50 MG tablet Take 50 mg by mouth daily.    . fluticasone (FLONASE) 50 MCG/ACT nasal spray Place 1 spray into both nostrils daily.    Marland Kitchen omeprazole (PRILOSEC) 40 MG capsule Take 40 mg by mouth daily.     No current facility-administered medications for this visit.     ALLERGIES: Azithromycin, Guaifenesin, Other, and Penicillins  Family History  Problem Relation Age of Onset  . Arthritis Mother   .  Hyperlipidemia Mother   . Melanoma Mother   . Alcohol abuse Father        grandparents, aunt  . Hyperlipidemia Father        grandparents  . Hypertension Father   . Diabetes Father   . Arthritis Father   . Breast cancer Other        materna-grandmother, maternal aunt  . Crohn's disease Sister     Social History   Socioeconomic History  . Marital status: Married    Spouse name: Not on file  . Number of children: Not on file  . Years of education: Not on file  . Highest education level: Not on file  Occupational History  . Not on file  Tobacco Use  . Smoking status: Never Smoker  . Smokeless tobacco: Never Used  Substance and Sexual Activity  . Alcohol use: Yes    Alcohol/week: 1.0 standard drinks    Types: 1 Cans of beer per week    Comment: occ  . Drug use: No  . Sexual activity: Yes    Birth control/protection: Other-see comments    Comment: Husband had vasectomy   Other Topics Concern  . Not on file  Social History Narrative  . Not on file   Social Determinants of Health   Financial Resource Strain:   . Difficulty of Paying Living Expenses:   Food Insecurity:   . Worried About Charity fundraiser in the Last Year:   . Arboriculturist in the Last Year:   Transportation Needs:   . Film/video editor (Medical):   Marland Kitchen Lack of Transportation (Non-Medical):   Physical Activity:   . Days of Exercise per Week:   . Minutes of Exercise per Session:   Stress:   . Feeling of Stress :   Social Connections:   . Frequency of Communication with Friends and Family:   . Frequency of Social Gatherings with Friends and Family:   . Attends Religious Services:   . Active Member of Clubs or Organizations:   . Attends Archivist Meetings:   Marland Kitchen Marital Status:   Intimate Partner Violence:   . Fear of Current or Ex-Partner:   . Emotionally Abused:   Marland Kitchen Physically Abused:   . Sexually Abused:     Review of Systems  Constitutional: Negative.   HENT: Negative.    Eyes: Negative.   Respiratory: Negative.   Cardiovascular: Negative.   Gastrointestinal: Negative.   Genitourinary: Negative.   Musculoskeletal: Negative.   Skin: Negative.   Neurological: Negative.   Endo/Heme/Allergies: Negative.   Psychiatric/Behavioral: Negative.     PHYSICAL EXAMINATION:    BP 112/70 (BP Location: Right Arm, Patient Position: Sitting, Cuff Size: Normal)   Pulse 95   Temp (!) 97.2 F (36.2 C) (Temporal)   Ht 5' 3.75" (1.619 m)  Wt 135 lb (61.2 kg)   LMP 08/04/2019   SpO2 96%   BMI 23.35 kg/m     General appearance: alert, cooperative and appears stated age Abdomen: soft, firm tender mass in her lower abdomen in the midline and toward her left, non distended,  no organomegaly  Pelvic: External genitalia:  no lesions              Urethra:  normal appearing urethra with no masses, tenderness or lesions              Bartholins and Skenes: normal                 Vagina: normal appearing vagina with normal color and discharge, no lesions              Cervix: no gross lesions              Bimanual Exam:  Uterus:  hard, tender, 14-16 week sized uterus, midline and deviated toward the patient's left, decreased mobility              Adnexa: no mass, fullness, tenderness and other than uterus                Colposcopy: unsatisfactory, mild acetowhite change at 6 o'clock, biopsy done. ECC done. Mild decreased lugols uptake on the right vaginal side wall, biopsy at 9 o'clock. Biopsy sites treated with silver nitrate.   Chaperone was present for exam.  ASSESSMENT ASCUS, +HPV pap Fibroid uterus, not anemic, having constant pressure/discomfort/urinary frequency. Report and images reviewed Low libido, more difficult to have an orgasm   PLAN Colposcopy with cervical/vaginal biopsies and ECC Discussed options of managing her fibroid. If it weren't bothering her she wouldn't have to do anything, but she has constant pressure, intermittent pain, frequent urination  and dyspareunia. We discussed options of uterine artery embolization, medication (short term fix) and laparoscopic hysterectomy. She desires laparoscopic hysterectomy. Given information on libido/orgasms. Awakening handout given    CC: Dr Rogers Blocker Note sent

## 2019-08-19 ENCOUNTER — Telehealth: Payer: Self-pay | Admitting: Obstetrics and Gynecology

## 2019-08-19 LAB — SURGICAL PATHOLOGY

## 2019-08-19 NOTE — Telephone Encounter (Signed)
Patient is returning call to [facility]. 

## 2019-08-19 NOTE — Telephone Encounter (Signed)
Spoke with patient regarding surgery benefits. Patient acknowledges understanding of information presented. Patient is aware that benefits presented are for professional benefits only. Patient is aware that once surgery is scheduled, the hospital will call with separate benefits. Patient is aware of surgery cancellation policy. ° °Patient is ready to proceed with scheduling. °

## 2019-08-19 NOTE — Telephone Encounter (Signed)
Call to patient. Per DPR, OK to leave message on voicemail.   Left voicemail requesting a return call to [facility] to review benefits for recommended with Sumner Boast, MD

## 2019-08-20 ENCOUNTER — Telehealth: Payer: Self-pay

## 2019-08-20 NOTE — Telephone Encounter (Signed)
Spoke with patient. Patient would like to proceed with surgery on 10/14/2019. Surgery scheduled for 10/14/2019 at 0730 at [facility]. Pre op scheduled for 09/24/2019 at 3:30 pm with Dr.Jertson. COVID test scheduled for 10/10/2019 at 3:15 pm at [facility] location. Patient is aware of the need to quarantine after test until surgery. 1 week post op scheduled for 10/29/2019 at 1 pm with Dr.Jertson. 6 week post op scheduled for 11/26/2019 at 3:30 pm with Dr.Jertson. Patient is agreeable to all dates and times. Surgery instructions reviewed. Patient verbalizes understanding. Surgery packet to [facility] to give to patient at her pre op appointment.  Routing to provider and will close encounter.

## 2019-08-20 NOTE — Telephone Encounter (Signed)
4 weeks out would be fine for a trip to the beach.

## 2019-08-20 NOTE — Telephone Encounter (Signed)
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 She needs a f/u pap in one year (even after hysterectomy for fibroid)

## 2019-08-20 NOTE — Telephone Encounter (Signed)
Spoke with patient. Patient states that she has a beach trip planned this summer and is wanting to know if she is 4 weeks out from surgery if it would be okay for her to go to the beach and be in the ocean. Advised will review with Dr.Jertson and return call.

## 2019-08-20 NOTE — Telephone Encounter (Signed)
Spoke with pt. Pt given results and recommendations per Dr Talbert Nan. Pt agreeable and verbalized understanding. 12 recall placed for 1 year f/u pap.  Encounter closed.

## 2019-08-21 ENCOUNTER — Encounter: Payer: Self-pay | Admitting: Family Medicine

## 2019-08-21 DIAGNOSIS — R87619 Unspecified abnormal cytological findings in specimens from cervix uteri: Secondary | ICD-10-CM | POA: Insufficient documentation

## 2019-09-20 ENCOUNTER — Other Ambulatory Visit: Payer: Self-pay

## 2019-09-24 ENCOUNTER — Encounter: Payer: Self-pay | Admitting: Obstetrics and Gynecology

## 2019-09-24 ENCOUNTER — Ambulatory Visit (INDEPENDENT_AMBULATORY_CARE_PROVIDER_SITE_OTHER): Payer: 59 | Admitting: Obstetrics and Gynecology

## 2019-09-24 ENCOUNTER — Other Ambulatory Visit: Payer: Self-pay

## 2019-09-24 VITALS — BP 124/64 | HR 82 | Temp 98.1°F | Ht 64.0 in | Wt 136.0 lb

## 2019-09-24 DIAGNOSIS — N920 Excessive and frequent menstruation with regular cycle: Secondary | ICD-10-CM

## 2019-09-24 DIAGNOSIS — N946 Dysmenorrhea, unspecified: Secondary | ICD-10-CM

## 2019-09-24 DIAGNOSIS — N941 Unspecified dyspareunia: Secondary | ICD-10-CM

## 2019-09-24 DIAGNOSIS — D259 Leiomyoma of uterus, unspecified: Secondary | ICD-10-CM

## 2019-09-24 NOTE — Progress Notes (Signed)
GYNECOLOGY  VISIT   HPI: 39 y.o.   Married   Other or two or more races White or Caucasian Hispanic or Latino  female   [place] with Patient's last menstrual period was 09/03/2019.   here for a preoperative visit for    She has a symptomatic fibroid uterus, with menorrhagia, bulk symptoms, pain, dysmenorrhea and dyspareunia. She desires definitive treatment. Normal TSH and CBC in 4/21.   ULTRASOUND from 07/31/19: CLINICAL DATA: Patient with irregular menstrual cycle.  EXAM: TRANSABDOMINAL AND TRANSVAGINAL ULTRASOUND OF PELVIS  TECHNIQUE: Both transabdominal and transvaginal ultrasound examinations of the pelvis were performed. Transabdominal technique was performed for global imaging of the pelvis including uterus, ovaries, adnexal regions, and pelvic cul-de-sac. It was necessary to proceed with endovaginal exam following the transabdominal exam to visualize the endometrium.  COMPARISON: None  FINDINGS: Uterus  Measurements: 16.2 x 9.8 x 10.9 cm = volume: 903 mL. There is a 9.7 x 2.7 x 9.7 cm large fibroid involving the superior slightly rightward aspect of the uterine fundus.  Endometrium  Thickness: 3 mm. No focal abnormality visualized.  Right ovary  Measurements: 2.2 x 1.7 x 2.6 cm = volume: 3.0 mL. There is a 1.1 x 1.7 x 1.0 cm paraovarian cyst.  Left ovary  Not visualized.  Other findings  No abnormal free fluid.  IMPRESSION: Endometrium measures 3 mm. If bleeding remains unresponsive to hormonal or medical therapy, sonohysterogram should be considered for focal lesion work-up. (Ref: Radiological Reasoning: Algorithmic Workup of Abnormal Vaginal Bleeding with Endovaginal Sonography and Sonohysterography. AJR 2008; 599:J57-01)   Last pap on 07/24/19 was ASCUS, +HPV. Colposcopy was done on 08/15/19, unsatisfactory. Biopsies returned with CIN I, VAIN I, negative ECC.  GYNECOLOGIC HISTORY: Patient's last menstrual period was  09/03/2019. Contraception:Vasectomy Menopausal hormone therapy: none        OB History    Gravida  0   Para  0   Term  0   Preterm  0   AB  0   Living  0     SAB  0   TAB  0   Ectopic  0   Multiple  0   Live Births                 Patient Active Problem List   Diagnosis Date Noted  . Abnormal Pap smear of cervix 08/21/2019  . IBS (irritable bowel syndrome) 07/24/2019  . ASTHMA, EXERCISE INDUCED 04/09/2009  . GERD 04/09/2009    Past Medical History:  Diagnosis Date  . Abnormal uterine bleeding   . Allergy   . Anxiety   . Arthritis   . Asthma   . ASTHMA, EXERCISE INDUCED 04/09/2009  . GERD 04/09/2009  . GERD (gastroesophageal reflux disease)   . 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 COLPOSCOPY    . WISDOM TOOTH EXTRACTION  Highschool    Current Outpatient Medications  Medication Sig Dispense Refill  . Biotin 10000 MCG TABS Take 10,000 mcg by mouth daily.    Marland Kitchen CANNABIDIOL PO Take 1 drop by mouth daily as needed (pain).    . cetirizine (ZYRTEC) 10 MG tablet Take 10 mg by mouth daily.    Marland Kitchen doxycycline (ADOXA) 50 MG tablet Take 50 mg by mouth daily.    . fluticasone (FLONASE) 50 MCG/ACT nasal spray Place 1 spray into both nostrils daily.    . Multiple Vitamin (MULTIVITAMIN WITH MINERALS) TABS tablet Take  1 tablet by mouth daily.    Marland Kitchen omeprazole (PRILOSEC) 40 MG capsule Take 40 mg by mouth daily.    . psyllium (METAMUCIL SMOOTH TEXTURE) 28 % packet Take 1 packet by mouth daily.     No current facility-administered medications for this visit.     ALLERGIES: Clindamycin/lincomycin, Azithromycin, Guaifenesin, Other, and Penicillins. Clindamycin, azithromycin guaifenesin vomiting and esophageal spasm. PCN bad rash as a child.  Thinks she has had a cephalosporin.    Family History  Problem Relation Age of Onset  . Arthritis Mother   . Hyperlipidemia Mother   . Melanoma Mother   . Alcohol  abuse Father        grandparents, aunt  . Hyperlipidemia Father        grandparents  . Hypertension Father   . Diabetes Father   . Arthritis Father   . Breast cancer Other        materna-grandmother, maternal aunt  . Crohn's disease Sister   Dad bladder cancer.   Social History   Socioeconomic History  . Marital status: Married    Spouse name: Not on file  . Number of children: Not on file  . Years of education: Not on file  . Highest education level: Not on file  Occupational History  . Not on file  Tobacco Use  . Smoking status: Never Smoker  . Smokeless tobacco: Never Used  Vaping Use  . Vaping Use: Never used  Substance and Sexual Activity  . Alcohol use: Yes    Alcohol/week: 1.0 standard drink    Types: 1 Cans of beer per week    Comment: occ  . Drug use: No  . Sexual activity: Yes    Birth control/protection: Other-see comments    Comment: Husband had vasectomy   Other Topics Concern  . Not on file  Social History Narrative  . Not on file   Social Determinants of Health   Financial Resource Strain:   . Difficulty of Paying Living Expenses:   Food Insecurity:   . Worried About Charity fundraiser in the Last Year:   . Arboriculturist in the Last Year:   Transportation Needs:   . Film/video editor (Medical):   Marland Kitchen Lack of Transportation (Non-Medical):   Physical Activity:   . Days of Exercise per Week:   . Minutes of Exercise per Session:   Stress:   . Feeling of Stress :   Social Connections:   . Frequency of Communication with Friends and Family:   . Frequency of Social Gatherings with Friends and Family:   . Attends Religious Services:   . Active Member of Clubs or Organizations:   . Attends Archivist Meetings:   Marland Kitchen Marital Status:   Intimate Partner Violence:   . Fear of Current or Ex-Partner:   . Emotionally Abused:   Marland Kitchen Physically Abused:   . Sexually Abused:     Review of Systems  Gastrointestinal: Positive for  constipation.  All other systems reviewed and are negative.   PHYSICAL EXAMINATION:    BP 124/64   Pulse 82   Temp 98.1 F (36.7 C)   Ht 5\' 4"  (1.626 m)   Wt 136 lb (61.7 kg)   LMP 09/03/2019   SpO2 98%   BMI 23.34 kg/m     General appearance: alert, cooperative and appears stated age Neck: no adenopathy, supple, symmetrical, trachea midline and thyroid normal to inspection and palpation Heart: regular rate and rhythm Lungs:  CTAB Abdomen: soft, non-tender; uterus palpated to just under her umbilicus, mobile, 16 week sized Extremities: normal, atraumatic, no cyanosis Skin: normal color, texture and turgor, no rashes or lesions Lymph: normal cervical supraclavicular and inguinal nodes Neurologic: grossly normal    Pelvic: deferred today Exam from 08/15/19: Bimanual Exam:  Uterus:  hard, tender, 14-16 week sized uterus, midline and deviated toward the patient's left, decreased mobility              Adnexa: no mass, fullness, tenderness and other than uterus  ASSESSMENT Symptomatic fibroid uterus. Desires definitive surgery. Other options have been discussed.   PLAN Discussed total laparoscopic hysterectomy, bilateral salpingectomies and cystoscopy. Reviewed the risks of the procedure, including infection, bleeding, damage to bowel/badder/vessels/ureters.  Discussed the possible need for laparotomy. All of her questions were answered

## 2019-09-24 NOTE — H&P (View-Only) (Signed)
GYNECOLOGY  VISIT   HPI: 39 y.o.   Married   Other or two or more races White or Caucasian Hispanic or Latino  female   [place] with Patient's last menstrual period was 09/03/2019.   here for a preoperative visit for    She has a symptomatic fibroid uterus, with menorrhagia, bulk symptoms, pain, dysmenorrhea and dyspareunia. She desires definitive treatment. Normal TSH and CBC in 4/21.   ULTRASOUND from 07/31/19: CLINICAL DATA: Patient with irregular menstrual cycle.  EXAM: TRANSABDOMINAL AND TRANSVAGINAL ULTRASOUND OF PELVIS  TECHNIQUE: Both transabdominal and transvaginal ultrasound examinations of the pelvis were performed. Transabdominal technique was performed for global imaging of the pelvis including uterus, ovaries, adnexal regions, and pelvic cul-de-sac. It was necessary to proceed with endovaginal exam following the transabdominal exam to visualize the endometrium.  COMPARISON: None  FINDINGS: Uterus  Measurements: 16.2 x 9.8 x 10.9 cm = volume: 903 mL. There is a 9.7 x 2.7 x 9.7 cm large fibroid involving the superior slightly rightward aspect of the uterine fundus.  Endometrium  Thickness: 3 mm. No focal abnormality visualized.  Right ovary  Measurements: 2.2 x 1.7 x 2.6 cm = volume: 3.0 mL. There is a 1.1 x 1.7 x 1.0 cm paraovarian cyst.  Left ovary  Not visualized.  Other findings  No abnormal free fluid.  IMPRESSION: Endometrium measures 3 mm. If bleeding remains unresponsive to hormonal or medical therapy, sonohysterogram should be considered for focal lesion work-up. (Ref: Radiological Reasoning: Algorithmic Workup of Abnormal Vaginal Bleeding with Endovaginal Sonography and Sonohysterography. AJR 2008; 169:C78-93)   Last pap on 07/24/19 was ASCUS, +HPV. Colposcopy was done on 08/15/19, unsatisfactory. Biopsies returned with CIN I, VAIN I, negative ECC.  GYNECOLOGIC HISTORY: Patient's last menstrual period was  09/03/2019. Contraception:Vasectomy Menopausal hormone therapy: none        OB History    Gravida  0   Para  0   Term  0   Preterm  0   AB  0   Living  0     SAB  0   TAB  0   Ectopic  0   Multiple  0   Live Births                 Patient Active Problem List   Diagnosis Date Noted  . Abnormal Pap smear of cervix 08/21/2019  . IBS (irritable bowel syndrome) 07/24/2019  . ASTHMA, EXERCISE INDUCED 04/09/2009  . GERD 04/09/2009    Past Medical History:  Diagnosis Date  . Abnormal uterine bleeding   . Allergy   . Anxiety   . Arthritis   . Asthma   . ASTHMA, EXERCISE INDUCED 04/09/2009  . GERD 04/09/2009  . GERD (gastroesophageal reflux disease)   . 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 COLPOSCOPY    . WISDOM TOOTH EXTRACTION  Highschool    Current Outpatient Medications  Medication Sig Dispense Refill  . Biotin 10000 MCG TABS Take 10,000 mcg by mouth daily.    Marland Kitchen CANNABIDIOL PO Take 1 drop by mouth daily as needed (pain).    . cetirizine (ZYRTEC) 10 MG tablet Take 10 mg by mouth daily.    Marland Kitchen doxycycline (ADOXA) 50 MG tablet Take 50 mg by mouth daily.    . fluticasone (FLONASE) 50 MCG/ACT nasal spray Place 1 spray into both nostrils daily.    . Multiple Vitamin (MULTIVITAMIN WITH MINERALS) TABS tablet Take  1 tablet by mouth daily.    Marland Kitchen omeprazole (PRILOSEC) 40 MG capsule Take 40 mg by mouth daily.    . psyllium (METAMUCIL SMOOTH TEXTURE) 28 % packet Take 1 packet by mouth daily.     No current facility-administered medications for this visit.     ALLERGIES: Clindamycin/lincomycin, Azithromycin, Guaifenesin, Other, and Penicillins. Clindamycin, azithromycin guaifenesin vomiting and esophageal spasm. PCN bad rash as a child.  Thinks she has had a cephalosporin.    Family History  Problem Relation Age of Onset  . Arthritis Mother   . Hyperlipidemia Mother   . Melanoma Mother   . Alcohol  abuse Father        grandparents, aunt  . Hyperlipidemia Father        grandparents  . Hypertension Father   . Diabetes Father   . Arthritis Father   . Breast cancer Other        materna-grandmother, maternal aunt  . Crohn's disease Sister   Dad bladder cancer.   Social History   Socioeconomic History  . Marital status: Married    Spouse name: Not on file  . Number of children: Not on file  . Years of education: Not on file  . Highest education level: Not on file  Occupational History  . Not on file  Tobacco Use  . Smoking status: Never Smoker  . Smokeless tobacco: Never Used  Vaping Use  . Vaping Use: Never used  Substance and Sexual Activity  . Alcohol use: Yes    Alcohol/week: 1.0 standard drink    Types: 1 Cans of beer per week    Comment: occ  . Drug use: No  . Sexual activity: Yes    Birth control/protection: Other-see comments    Comment: Husband had vasectomy   Other Topics Concern  . Not on file  Social History Narrative  . Not on file   Social Determinants of Health   Financial Resource Strain:   . Difficulty of Paying Living Expenses:   Food Insecurity:   . Worried About Charity fundraiser in the Last Year:   . Arboriculturist in the Last Year:   Transportation Needs:   . Film/video editor (Medical):   Marland Kitchen Lack of Transportation (Non-Medical):   Physical Activity:   . Days of Exercise per Week:   . Minutes of Exercise per Session:   Stress:   . Feeling of Stress :   Social Connections:   . Frequency of Communication with Friends and Family:   . Frequency of Social Gatherings with Friends and Family:   . Attends Religious Services:   . Active Member of Clubs or Organizations:   . Attends Archivist Meetings:   Marland Kitchen Marital Status:   Intimate Partner Violence:   . Fear of Current or Ex-Partner:   . Emotionally Abused:   Marland Kitchen Physically Abused:   . Sexually Abused:     Review of Systems  Gastrointestinal: Positive for  constipation.  All other systems reviewed and are negative.   PHYSICAL EXAMINATION:    BP 124/64   Pulse 82   Temp 98.1 F (36.7 C)   Ht 5\' 4"  (1.626 m)   Wt 136 lb (61.7 kg)   LMP 09/03/2019   SpO2 98%   BMI 23.34 kg/m     General appearance: alert, cooperative and appears stated age Neck: no adenopathy, supple, symmetrical, trachea midline and thyroid normal to inspection and palpation Heart: regular rate and rhythm Lungs:  CTAB Abdomen: soft, non-tender; uterus palpated to just under her umbilicus, mobile, 16 week sized Extremities: normal, atraumatic, no cyanosis Skin: normal color, texture and turgor, no rashes or lesions Lymph: normal cervical supraclavicular and inguinal nodes Neurologic: grossly normal    Pelvic: deferred today Exam from 08/15/19: Bimanual Exam:  Uterus:  hard, tender, 14-16 week sized uterus, midline and deviated toward the patient's left, decreased mobility              Adnexa: no mass, fullness, tenderness and other than uterus  ASSESSMENT Symptomatic fibroid uterus. Desires definitive surgery. Other options have been discussed.   PLAN Discussed total laparoscopic hysterectomy, bilateral salpingectomies and cystoscopy. Reviewed the risks of the procedure, including infection, bleeding, damage to bowel/badder/vessels/ureters.  Discussed the possible need for laparotomy. All of her questions were answered

## 2019-10-08 NOTE — Patient Instructions (Addendum)
Get your Covid test at [place] on 10/10/19 at  3:15   [facility]       Your procedure is scheduled on 10/14/19   Report to [place]  at   5:30 A.M.   Call this number if you have problems the morning of [phone]   OUR ADDRESS IS [address], WE ARE LOCATED [facility] THE [facility] WITH [facility].   Remember:  Do not eat food after midnight.  You may have clear liquids until 4:30 AM   Take these medicines the morning of surgery with A SIP OF WATER: Zyrtec, Prilosec, Flonase if needed   Do not wear jewelry, make-up or nail polish.  Do not wear lotions, powders, or perfumes, or deoderant.  Do not shave 48 hours prior to surgery.   Do not bring valuables to the hospital.  [facility] is not responsible for any belongings or valuables.  Contacts, dentures or bridgework may not be worn into surgery.    For patients admitted to the hospital, discharge time will be determined by your treatment team.  Patients discharged the day of surgery will not be allowed to drive home.   Special instructions:  Bring your prescription Meds in their original bottles  Please read over the following fact sheets that you were given:    [facility] - Preparing for Surgery  Before surgery, you can play an important role.   Because skin is not sterile, your skin needs to be as free of germs as possible .  You can reduce the number of germs on your skin by washing with CHG (chlorahexidine gluconate) soap before surgery .  CHG is an antiseptic cleaner which kills germs and bonds with the skin to continue killing germs even after washing. Please DO NOT use if you have an allergy to CHG or antibacterial soaps.   If your skin becomes reddened/irritated stop using the CHG and inform your nurse when you arrive at Short Stay. Do not shave (including legs and underarms) for at least 48 hours prior to the first CHG shower.    Please follow these instructions carefully:  1.  Shower with CHG Soap the night before surgery and the  morning of Surgery.  2.  If you choose to wash your hair, wash your hair first as usual with your  normal  shampoo.  3.  After you shampoo, rinse your hair and body thoroughly to remove the  shampoo.                                        4.  Use CHG as you would any other liquid soap.  You can apply chg directly  to the skin and wash                       Gently with a scrungie or clean washcloth.  5.  Apply the CHG Soap to your body ONLY FROM THE NECK DOWN.   Do not use on face/ open                           Wound or open sores. Avoid contact with eyes, ears mouth and genitals (private parts).  Wash face,  Genitals (private parts) with your normal soap.             6.  Wash thoroughly, paying special attention to the area where your surgery  will be performed.  7.  Thoroughly rinse your body with warm water from the neck down.  8.  DO NOT shower/wash with your normal soap after using and rinsing off  the CHG Soap.             9.  Pat yourself dry with a clean towel.            10.  Wear clean pajamas.            11.  Place clean sheets on your bed the night of your first shower and do not  sleep with pets. Day of Surgery : Do not apply any lotions/deodorants the morning of surgery.  Please wear clean clothes to the hospital/surgery center.  FAILURE TO FOLLOW THESE INSTRUCTIONS MAY RESULT IN THE CANCELLATION OF YOUR SURGERY PATIENT SIGNATURE_________________________________  NURSE SIGNATURE__________________________________  ________________________________________________________________________

## 2019-10-09 ENCOUNTER — Encounter (HOSPITAL_COMMUNITY)
Admission: RE | Admit: 2019-10-09 | Discharge: 2019-10-09 | Disposition: A | Discharge status: Home / Self Care | Payer: 59 | Source: Ambulatory Visit | Attending: Obstetrics and Gynecology | Admitting: Obstetrics and Gynecology

## 2019-10-09 ENCOUNTER — Other Ambulatory Visit: Payer: Self-pay

## 2019-10-09 ENCOUNTER — Encounter (HOSPITAL_COMMUNITY): Payer: Self-pay

## 2019-10-09 DIAGNOSIS — Z01812 Encounter for preprocedural laboratory examination: Secondary | ICD-10-CM | POA: Insufficient documentation

## 2019-10-09 HISTORY — DX: Family history of other specified conditions: Z84.89

## 2019-10-09 LAB — COMPREHENSIVE METABOLIC PANEL
ALT: 24 U/L (ref 0–44)
AST: 24 U/L (ref 15–41)
Albumin: 4.2 g/dL (ref 3.5–5.0)
Alkaline Phosphatase: 40 U/L (ref 38–126)
Anion gap: 9 (ref 5–15)
BUN: 16 mg/dL (ref 6–20)
CO2: 26 mmol/L (ref 22–32)
Calcium: 8.7 mg/dL — ABNORMAL LOW (ref 8.9–10.3)
Chloride: 104 mmol/L (ref 98–111)
Creatinine, Ser: 0.68 mg/dL (ref 0.44–1.00)
GFR calc Af Amer: >60 mL/min (ref >60)
GFR calc non Af Amer: >60 mL/min (ref >60)
Glucose, Bld: 83 mg/dL (ref 70–99)
Potassium: 3.7 mmol/L (ref 3.5–5.1)
Sodium: 139 mmol/L (ref 135–145)
Total Bilirubin: 0.5 mg/dL (ref 0.3–1.2)
Total Protein: 7.2 g/dL (ref 6.5–8.1)

## 2019-10-09 LAB — CBC
HCT: 39.8 % (ref 36.0–46.0)
Hemoglobin: 13.2 g/dL (ref 12.0–15.0)
MCH: 31.9 pg (ref 26.0–34.0)
MCHC: 33.2 g/dL (ref 30.0–36.0)
MCV: 96.1 fL (ref 80.0–100.0)
Platelets: 261 10*3/uL (ref 150–400)
RBC: 4.14 MIL/uL (ref 3.87–5.11)
RDW: 11.7 % (ref 11.5–15.5)
WBC: 6 10*3/uL (ref 4.0–10.5)
nRBC: 0 % (ref 0.0–0.2)

## 2019-10-09 NOTE — Progress Notes (Signed)
COVID Vaccine Completed:no Date COVID Vaccine completed: COVID vaccine manufacturer: Pfizer    Moderna   [employer]   PCP - Dr. Hal Hope Cardiologist -no   Chest x-ray - no EKG - no Stress Test - no ECHO - no Cardiac Cath -no   Sleep Study -no  CPAP -   Fasting Blood Sugar - NA Checks Blood Sugar _____ times a day  Blood Thinner Instructions:NA Aspirin Instructions: Last Dose:  Anesthesia review:   Patient denies shortness of breath, fever, cough and chest pain at PAT appointment  yes   Patient verbalized understanding of instructions that were given to them at the PAT appointment. Patient was also instructed that they will need to review over the PAT instructions again at home before surgery.  Yes  Pr is in good physical condition. No SOB climbing stairs or with ADLs

## 2019-10-10 ENCOUNTER — Telehealth: Payer: Self-pay

## 2019-10-10 ENCOUNTER — Other Ambulatory Visit (HOSPITAL_COMMUNITY)
Admission: RE | Admit: 2019-10-10 | Discharge: 2019-10-10 | Disposition: A | Discharge status: Home / Self Care | Payer: 59 | Source: Ambulatory Visit | Attending: Obstetrics and Gynecology | Admitting: Obstetrics and Gynecology

## 2019-10-10 ENCOUNTER — Encounter: Payer: Self-pay | Admitting: Obstetrics and Gynecology

## 2019-10-10 DIAGNOSIS — Z01812 Encounter for preprocedural laboratory examination: Secondary | ICD-10-CM | POA: Diagnosis not present

## 2019-10-10 DIAGNOSIS — Z20822 Contact with and (suspected) exposure to covid-19: Secondary | ICD-10-CM | POA: Diagnosis not present

## 2019-10-10 LAB — SARS CORONAVIRUS 2 (TAT 6-24 HRS): SARS Coronavirus 2: NEGATIVE

## 2019-10-10 NOTE — Telephone Encounter (Signed)
Helmes, Zoria Christine  P [facility] Just confirming that you still have my Short term disability form and will keep me out for 6 weeks making my return to work August 23. If you could fax it to the number on the sheet and to [phone], just so my manger knows that [facility] received it would be great. Thank you so much!

## 2019-10-10 NOTE — Telephone Encounter (Signed)
Routing to Kaitlyn, RN °

## 2019-10-11 NOTE — Telephone Encounter (Signed)
Spoke with patient. Advised patient that forms have been faxed to the number provided [phone] and to [facility] at [phone]. Patient verbalizes understanding. Encounter closed.

## 2019-10-11 NOTE — Telephone Encounter (Signed)
Form to [place] for review and signature to send off.

## 2019-10-13 MED ORDER — GENTAMICIN SULFATE 40 MG/ML IJ SOLN
5.0000 mg/kg | INTRAVENOUS | Status: DC
  Filled 2019-10-13: qty 7.75

## 2019-10-13 MED ORDER — METRONIDAZOLE IN NACL 5-0.79 MG/ML-% IV SOLN: 500.0000 mg | INTRAVENOUS | Status: DC

## 2019-10-13 NOTE — Anesthesia Preprocedure Evaluation (Addendum)
Anesthesia Evaluation  Patient identified by MRN, date of birth, ID band Patient awake    Reviewed: Allergy & Precautions, NPO status , Patient's Chart, lab work & pertinent test results  History of Anesthesia Complications Negative for: history of anesthetic complications  Airway Mallampati: II  TM Distance: >3 FB Neck ROM: Full    Dental  (+) Dental Advisory Given   Pulmonary asthma (exercise induced: no inhaler needed in many years) ,  10/10/2019 SARS coronavirus NEG   breath sounds clear to auscultation       Cardiovascular negative cardio ROS   Rhythm:Regular Rate:Normal     Neuro/Psych Anxiety negative neurological ROS     GI/Hepatic Neg liver ROS, GERD  Controlled and Medicated,  Endo/Other  negative endocrine ROS  Renal/GU negative Renal ROS     Musculoskeletal   Abdominal   Peds  Hematology negative hematology ROS (+)   Anesthesia Other Findings   Reproductive/Obstetrics                           Anesthesia Physical Anesthesia Plan  ASA: II  Anesthesia Plan: General   Post-op Pain Management:    Induction: Intravenous  PONV Risk Score and Plan: 3 and Ondansetron, Dexamethasone and Scopolamine patch - Pre-op  Airway Management Planned: Oral ETT  Additional Equipment: None  Intra-op Plan:   Post-operative Plan: Extubation in OR  Informed Consent: I have reviewed the patients History and Physical, chart, labs and discussed the procedure including the risks, benefits and alternatives for the proposed anesthesia with the patient or authorized representative who has indicated his/her understanding and acceptance.     Dental advisory given  Plan Discussed with: CRNA and Surgeon  Anesthesia Plan Comments:       Anesthesia Quick Evaluation

## 2019-10-14 ENCOUNTER — Ambulatory Visit (HOSPITAL_BASED_OUTPATIENT_CLINIC_OR_DEPARTMENT_OTHER): Payer: 59 | Admitting: Anesthesiology

## 2019-10-14 ENCOUNTER — Encounter (HOSPITAL_BASED_OUTPATIENT_CLINIC_OR_DEPARTMENT_OTHER): Payer: Self-pay | Admitting: Obstetrics and Gynecology

## 2019-10-14 ENCOUNTER — Encounter (HOSPITAL_BASED_OUTPATIENT_CLINIC_OR_DEPARTMENT_OTHER)
Admission: RE | Disposition: A | Discharge status: Home / Self Care | Payer: Self-pay | Source: Home / Self Care | Attending: Obstetrics and Gynecology

## 2019-10-14 ENCOUNTER — Ambulatory Visit (HOSPITAL_BASED_OUTPATIENT_CLINIC_OR_DEPARTMENT_OTHER)
Admission: RE | Admit: 2019-10-14 | Discharge: 2019-10-14 | Disposition: A | Discharge status: Home / Self Care | Payer: 59 | Attending: Obstetrics and Gynecology | Admitting: Obstetrics and Gynecology

## 2019-10-14 DIAGNOSIS — K219 Gastro-esophageal reflux disease without esophagitis: Secondary | ICD-10-CM | POA: Insufficient documentation

## 2019-10-14 DIAGNOSIS — N920 Excessive and frequent menstruation with regular cycle: Secondary | ICD-10-CM | POA: Insufficient documentation

## 2019-10-14 DIAGNOSIS — N870 Mild cervical dysplasia: Secondary | ICD-10-CM

## 2019-10-14 DIAGNOSIS — Z880 Allergy status to penicillin: Secondary | ICD-10-CM | POA: Insufficient documentation

## 2019-10-14 DIAGNOSIS — Z79899 Other long term (current) drug therapy: Secondary | ICD-10-CM | POA: Insufficient documentation

## 2019-10-14 DIAGNOSIS — Z881 Allergy status to other antibiotic agents status: Secondary | ICD-10-CM | POA: Insufficient documentation

## 2019-10-14 DIAGNOSIS — Z91018 Allergy to other foods: Secondary | ICD-10-CM | POA: Insufficient documentation

## 2019-10-14 DIAGNOSIS — D251 Intramural leiomyoma of uterus: Secondary | ICD-10-CM | POA: Diagnosis not present

## 2019-10-14 DIAGNOSIS — N946 Dysmenorrhea, unspecified: Secondary | ICD-10-CM | POA: Insufficient documentation

## 2019-10-14 DIAGNOSIS — Z90710 Acquired absence of both cervix and uterus: Secondary | ICD-10-CM | POA: Diagnosis present

## 2019-10-14 DIAGNOSIS — N941 Unspecified dyspareunia: Secondary | ICD-10-CM | POA: Diagnosis not present

## 2019-10-14 DIAGNOSIS — D259 Leiomyoma of uterus, unspecified: Secondary | ICD-10-CM | POA: Diagnosis present

## 2019-10-14 DIAGNOSIS — J45990 Exercise induced bronchospasm: Secondary | ICD-10-CM | POA: Diagnosis not present

## 2019-10-14 DIAGNOSIS — K589 Irritable bowel syndrome without diarrhea: Secondary | ICD-10-CM | POA: Diagnosis not present

## 2019-10-14 HISTORY — PX: CYSTOSCOPY: SHX5120

## 2019-10-14 HISTORY — PX: TOTAL LAPAROSCOPIC HYSTERECTOMY WITH SALPINGECTOMY: SHX6742

## 2019-10-14 LAB — CBC
HCT: 33 % — ABNORMAL LOW (ref 36.0–46.0)
Hemoglobin: 11.2 g/dL — ABNORMAL LOW (ref 12.0–15.0)
MCH: 32.3 pg (ref 26.0–34.0)
MCHC: 33.9 g/dL (ref 30.0–36.0)
MCV: 95.1 fL (ref 80.0–100.0)
Platelets: 214 10*3/uL (ref 150–400)
RBC: 3.47 MIL/uL — ABNORMAL LOW (ref 3.87–5.11)
RDW: 11.9 % (ref 11.5–15.5)
WBC: 11.6 10*3/uL — ABNORMAL HIGH (ref 4.0–10.5)
nRBC: 0 % (ref 0.0–0.2)

## 2019-10-14 LAB — COMPREHENSIVE METABOLIC PANEL
ALT: 19 U/L (ref 0–44)
AST: 20 U/L (ref 15–41)
Albumin: 3.3 g/dL — ABNORMAL LOW (ref 3.5–5.0)
Alkaline Phosphatase: 33 U/L — ABNORMAL LOW (ref 38–126)
Anion gap: 7 (ref 5–15)
BUN: 18 mg/dL (ref 6–20)
CO2: 26 mmol/L (ref 22–32)
Calcium: 8.2 mg/dL — ABNORMAL LOW (ref 8.9–10.3)
Chloride: 101 mmol/L (ref 98–111)
Creatinine, Ser: 0.68 mg/dL (ref 0.44–1.00)
GFR calc Af Amer: >60 mL/min (ref >60)
GFR calc non Af Amer: >60 mL/min (ref >60)
Glucose, Bld: 215 mg/dL — ABNORMAL HIGH (ref 70–99)
Potassium: 4.4 mmol/L (ref 3.5–5.1)
Sodium: 134 mmol/L — ABNORMAL LOW (ref 135–145)
Total Bilirubin: 0.6 mg/dL (ref 0.3–1.2)
Total Protein: 5.8 g/dL — ABNORMAL LOW (ref 6.5–8.1)

## 2019-10-14 LAB — TYPE AND SCREEN
ABO/RH(D): O POS
Antibody Screen: NEGATIVE

## 2019-10-14 LAB — POCT PREGNANCY, URINE: Preg Test, Ur: NEGATIVE

## 2019-10-14 LAB — ABO/RH: ABO/RH(D): O POS

## 2019-10-14 SURGERY — HYSTERECTOMY, TOTAL, LAPAROSCOPIC, WITH SALPINGECTOMY
Anesthesia: General | Site: Bladder

## 2019-10-14 MED ORDER — FENTANYL CITRATE (PF) 100 MCG/2ML IJ SOLN
INTRAMUSCULAR | Status: AC
  Filled 2019-10-14: qty 2

## 2019-10-14 MED ORDER — IBUPROFEN 800 MG PO TABS
800.0000 mg | ORAL_TABLET | Freq: Four times a day (QID) | ORAL | Status: DC
  Administered 2019-10-14: 800 mg via ORAL

## 2019-10-14 MED ORDER — ACETAMINOPHEN 500 MG PO TABS
1000.0000 mg | ORAL_TABLET | Freq: Four times a day (QID) | ORAL | Status: DC
  Administered 2019-10-14: 1000 mg via ORAL

## 2019-10-14 MED ORDER — OXYCODONE HCL 5 MG/5ML PO SOLN: 5.0000 mg | Freq: Once | ORAL | Status: DC | PRN

## 2019-10-14 MED ORDER — DEXAMETHASONE SODIUM PHOSPHATE 10 MG/ML IJ SOLN
INTRAMUSCULAR | Status: AC
  Filled 2019-10-14: qty 1

## 2019-10-14 MED ORDER — BUPIVACAINE HCL (PF) 0.25 % IJ SOLN
INTRAMUSCULAR | Status: DC | PRN
  Administered 2019-10-14: 7 mL

## 2019-10-14 MED ORDER — ROPIVACAINE HCL 5 MG/ML IJ SOLN
INTRAMUSCULAR | Status: DC | PRN
  Administered 2019-10-14: 30 mL

## 2019-10-14 MED ORDER — MIDAZOLAM HCL 5 MG/5ML IJ SOLN
INTRAMUSCULAR | Status: DC | PRN
  Administered 2019-10-14: 2 mg via INTRAVENOUS

## 2019-10-14 MED ORDER — ALUM & MAG HYDROXIDE-SIMETH 200-200-20 MG/5ML PO SUSP: 30.0000 mL | ORAL | Status: DC | PRN

## 2019-10-14 MED ORDER — ACETAMINOPHEN 500 MG PO TABS
ORAL_TABLET | ORAL | Status: AC
  Filled 2019-10-14: qty 2

## 2019-10-14 MED ORDER — ENOXAPARIN SODIUM 40 MG/0.4ML ~~LOC~~ SOLN
SUBCUTANEOUS | Status: AC
  Filled 2019-10-14: qty 0.4

## 2019-10-14 MED ORDER — GABAPENTIN 300 MG PO CAPS
300.0000 mg | ORAL_CAPSULE | ORAL | Status: AC
  Administered 2019-10-14: 300 mg via ORAL

## 2019-10-14 MED ORDER — LIDOCAINE 2% (20 MG/ML) 5 ML SYRINGE
INTRAMUSCULAR | Status: DC | PRN
  Administered 2019-10-14: 40 mg via INTRAVENOUS

## 2019-10-14 MED ORDER — ROCURONIUM BROMIDE 10 MG/ML (PF) SYRINGE
PREFILLED_SYRINGE | INTRAVENOUS | Status: AC
  Filled 2019-10-14: qty 10

## 2019-10-14 MED ORDER — ROPIVACAINE HCL 5 MG/ML IJ SOLN
INTRAMUSCULAR | Status: AC
  Filled 2019-10-14: qty 30

## 2019-10-14 MED ORDER — ONDANSETRON HCL 4 MG/2ML IJ SOLN
INTRAMUSCULAR | Status: DC | PRN
  Administered 2019-10-14: 4 mg via INTRAVENOUS

## 2019-10-14 MED ORDER — SODIUM CHLORIDE 0.9 % IR SOLN
Status: DC | PRN
  Administered 2019-10-14: 1000 mL

## 2019-10-14 MED ORDER — METRONIDAZOLE IN NACL 5-0.79 MG/ML-% IV SOLN
INTRAVENOUS | Status: AC
  Filled 2019-10-14: qty 100

## 2019-10-14 MED ORDER — KETOROLAC TROMETHAMINE 30 MG/ML IJ SOLN: 30.0000 mg | Freq: Once | INTRAMUSCULAR | Status: DC

## 2019-10-14 MED ORDER — VASOPRESSIN 20 UNIT/ML IV SOLN
INTRAVENOUS | Status: AC
  Filled 2019-10-14: qty 1

## 2019-10-14 MED ORDER — BSS IO SOLN
15.0000 mL | Freq: Once | INTRAOCULAR | Status: AC
  Administered 2019-10-14: 15 mL
  Filled 2019-10-14: qty 15

## 2019-10-14 MED ORDER — WHITE PETROLATUM EX OINT
TOPICAL_OINTMENT | CUTANEOUS | Status: AC
  Filled 2019-10-14: qty 5

## 2019-10-14 MED ORDER — PHENYLEPHRINE 40 MCG/ML (10ML) SYRINGE FOR IV PUSH (FOR BLOOD PRESSURE SUPPORT)
PREFILLED_SYRINGE | INTRAVENOUS | Status: DC | PRN
  Administered 2019-10-14: 80 ug via INTRAVENOUS

## 2019-10-14 MED ORDER — DOCUSATE SODIUM 100 MG PO CAPS
100.0000 mg | ORAL_CAPSULE | Freq: Two times a day (BID) | ORAL | Status: DC
  Administered 2019-10-14: 100 mg via ORAL

## 2019-10-14 MED ORDER — OXYCODONE HCL 5 MG PO TABS
ORAL_TABLET | ORAL | Status: AC
  Filled 2019-10-14: qty 1

## 2019-10-14 MED ORDER — KCL IN DEXTROSE-NACL 20-5-0.45 MEQ/L-%-% IV SOLN
INTRAVENOUS | Status: DC
  Administered 2019-10-14: 125 mL/h via INTRAVENOUS
  Filled 2019-10-14: qty 1000

## 2019-10-14 MED ORDER — ZOLPIDEM TARTRATE 5 MG PO TABS: 5.0000 mg | ORAL_TABLET | Freq: Every evening | ORAL | Status: DC | PRN

## 2019-10-14 MED ORDER — MENTHOL 3 MG MT LOZG: 1.0000 | LOZENGE | OROMUCOSAL | Status: DC | PRN

## 2019-10-14 MED ORDER — ONDANSETRON HCL 4 MG PO TABS
ORAL_TABLET | ORAL | Status: AC
  Filled 2019-10-14: qty 1

## 2019-10-14 MED ORDER — KETOROLAC TROMETHAMINE 30 MG/ML IJ SOLN
INTRAMUSCULAR | Status: DC | PRN
  Administered 2019-10-14: 30 mg via INTRAVENOUS

## 2019-10-14 MED ORDER — PANTOPRAZOLE SODIUM 40 MG PO TBEC: 80.0000 mg | DELAYED_RELEASE_TABLET | Freq: Every day | ORAL | Status: DC

## 2019-10-14 MED ORDER — BUPIVACAINE HCL (PF) 0.25 % IJ SOLN
INTRAMUSCULAR | Status: AC
  Filled 2019-10-14: qty 30

## 2019-10-14 MED ORDER — ENOXAPARIN SODIUM 40 MG/0.4ML ~~LOC~~ SOLN
40.0000 mg | SUBCUTANEOUS | Status: AC
  Administered 2019-10-14: 40 mg via SUBCUTANEOUS

## 2019-10-14 MED ORDER — MIDAZOLAM HCL 2 MG/2ML IJ SOLN
INTRAMUSCULAR | Status: AC
  Filled 2019-10-14: qty 2

## 2019-10-14 MED ORDER — HYDROMORPHONE HCL 2 MG/ML IJ SOLN
INTRAMUSCULAR | Status: AC
  Filled 2019-10-14: qty 1

## 2019-10-14 MED ORDER — METRONIDAZOLE IN NACL 5-0.79 MG/ML-% IV SOLN
500.0000 mg | INTRAVENOUS | Status: AC
  Administered 2019-10-14: 500 mg via INTRAVENOUS

## 2019-10-14 MED ORDER — PROPOFOL 10 MG/ML IV BOLUS
INTRAVENOUS | Status: AC
  Filled 2019-10-14: qty 40

## 2019-10-14 MED ORDER — KETOROLAC TROMETHAMINE 0.5 % OP SOLN
1.0000 [drp] | Freq: Three times a day (TID) | OPHTHALMIC | Status: DC | PRN
  Administered 2019-10-14: 1 [drp] via OPHTHALMIC
  Filled 2019-10-14: qty 3

## 2019-10-14 MED ORDER — OXYCODONE HCL 5 MG PO TABS
5.0000 mg | ORAL_TABLET | ORAL | Status: DC | PRN
  Administered 2019-10-14: 5 mg via ORAL

## 2019-10-14 MED ORDER — GENTAMICIN SULFATE 40 MG/ML IJ SOLN
5.0000 mg/kg | INTRAVENOUS | Status: AC
  Administered 2019-10-14: 310 mg via INTRAVENOUS
  Filled 2019-10-14: qty 7.75

## 2019-10-14 MED ORDER — SCOPOLAMINE 1 MG/3DAYS TD PT72
1.0000 | MEDICATED_PATCH | Freq: Once | TRANSDERMAL | Status: DC
  Administered 2019-10-14: 1.5 mg via TRANSDERMAL

## 2019-10-14 MED ORDER — POVIDONE-IODINE 10 % EX SWAB
2.0000 "application " | Freq: Once | CUTANEOUS | Status: AC
  Administered 2019-10-14: 2 via TOPICAL

## 2019-10-14 MED ORDER — DOCUSATE SODIUM 100 MG PO CAPS: 100.0000 mg | ORAL_CAPSULE | Freq: Two times a day (BID) | ORAL | 0 refills | Status: AC

## 2019-10-14 MED ORDER — PROMETHAZINE HCL 25 MG/ML IJ SOLN: 6.2500 mg | INTRAMUSCULAR | Status: DC | PRN

## 2019-10-14 MED ORDER — MIDAZOLAM HCL 2 MG/2ML IJ SOLN: 0.5000 mg | Freq: Once | INTRAMUSCULAR | Status: DC | PRN

## 2019-10-14 MED ORDER — DOCUSATE SODIUM 100 MG PO CAPS
ORAL_CAPSULE | ORAL | Status: AC
  Filled 2019-10-14: qty 1

## 2019-10-14 MED ORDER — SODIUM CHLORIDE (PF) 0.9 % IJ SOLN
INTRAMUSCULAR | Status: DC | PRN
  Administered 2019-10-14: 50 mL

## 2019-10-14 MED ORDER — IBUPROFEN 800 MG PO TABS: 800.0000 mg | ORAL_TABLET | Freq: Three times a day (TID) | ORAL | 0 refills | Status: DC

## 2019-10-14 MED ORDER — OXYCODONE HCL 5 MG PO TABS: 5.0000 mg | ORAL_TABLET | ORAL | 0 refills | Status: DC | PRN

## 2019-10-14 MED ORDER — FENTANYL CITRATE (PF) 100 MCG/2ML IJ SOLN
INTRAMUSCULAR | Status: DC | PRN
  Administered 2019-10-14 (×6): 50 ug via INTRAVENOUS

## 2019-10-14 MED ORDER — KETOROLAC TROMETHAMINE 30 MG/ML IJ SOLN: 30.0000 mg | Freq: Four times a day (QID) | INTRAMUSCULAR | Status: DC

## 2019-10-14 MED ORDER — DEXAMETHASONE SODIUM PHOSPHATE 10 MG/ML IJ SOLN
INTRAMUSCULAR | Status: DC | PRN
  Administered 2019-10-14 (×2): 5 mg via INTRAVENOUS

## 2019-10-14 MED ORDER — LACTATED RINGERS IV SOLN: INTRAVENOUS | Status: DC

## 2019-10-14 MED ORDER — ONDANSETRON HCL 4 MG/2ML IJ SOLN
INTRAMUSCULAR | Status: AC
  Filled 2019-10-14: qty 2

## 2019-10-14 MED ORDER — LIDOCAINE 2% (20 MG/ML) 5 ML SYRINGE
INTRAMUSCULAR | Status: AC
  Filled 2019-10-14: qty 5

## 2019-10-14 MED ORDER — ONDANSETRON HCL 4 MG PO TABS
4.0000 mg | ORAL_TABLET | Freq: Four times a day (QID) | ORAL | Status: DC | PRN
  Administered 2019-10-14: 4 mg via ORAL

## 2019-10-14 MED ORDER — KETOROLAC TROMETHAMINE 30 MG/ML IJ SOLN
INTRAMUSCULAR | Status: AC
  Filled 2019-10-14: qty 1

## 2019-10-14 MED ORDER — ACETAMINOPHEN 500 MG PO TABS
1000.0000 mg | ORAL_TABLET | Freq: Once | ORAL | Status: AC
  Administered 2019-10-14: 1000 mg via ORAL

## 2019-10-14 MED ORDER — ENOXAPARIN SODIUM 40 MG/0.4ML ~~LOC~~ SOLN: 40.0000 mg | SUBCUTANEOUS | Status: DC

## 2019-10-14 MED ORDER — OXYCODONE HCL 5 MG PO TABS: 5.0000 mg | ORAL_TABLET | Freq: Once | ORAL | Status: DC | PRN

## 2019-10-14 MED ORDER — MEPERIDINE HCL 25 MG/ML IJ SOLN: 6.2500 mg | INTRAMUSCULAR | Status: DC | PRN

## 2019-10-14 MED ORDER — GABAPENTIN 300 MG PO CAPS
ORAL_CAPSULE | ORAL | Status: AC
  Filled 2019-10-14: qty 1

## 2019-10-14 MED ORDER — ACETAMINOPHEN 500 MG PO TABS: 1000.0000 mg | ORAL_TABLET | Freq: Four times a day (QID) | ORAL | 0 refills | Status: DC

## 2019-10-14 MED ORDER — ONDANSETRON HCL 4 MG/2ML IJ SOLN: 4.0000 mg | Freq: Four times a day (QID) | INTRAMUSCULAR | Status: DC | PRN

## 2019-10-14 MED ORDER — IBUPROFEN 800 MG PO TABS
ORAL_TABLET | ORAL | Status: AC
  Filled 2019-10-14: qty 1

## 2019-10-14 MED ORDER — PROPOFOL 10 MG/ML IV BOLUS
INTRAVENOUS | Status: DC | PRN
  Administered 2019-10-14: 150 mg via INTRAVENOUS

## 2019-10-14 MED ORDER — ACETAMINOPHEN 500 MG PO TABS: 1000.0000 mg | ORAL_TABLET | ORAL | Status: AC

## 2019-10-14 MED ORDER — FLUTICASONE PROPIONATE 50 MCG/ACT NA SUSP
1.0000 | Freq: Every day | NASAL | Status: DC
  Filled 2019-10-14: qty 16

## 2019-10-14 MED ORDER — ROCURONIUM BROMIDE 10 MG/ML (PF) SYRINGE
PREFILLED_SYRINGE | INTRAVENOUS | Status: DC | PRN
  Administered 2019-10-14 (×2): 10 mg via INTRAVENOUS
  Administered 2019-10-14: 20 mg via INTRAVENOUS
  Administered 2019-10-14: 10 mg via INTRAVENOUS
  Administered 2019-10-14: 60 mg via INTRAVENOUS
  Administered 2019-10-14: 10 mg via INTRAVENOUS

## 2019-10-14 MED ORDER — SCOPOLAMINE 1 MG/3DAYS TD PT72
MEDICATED_PATCH | TRANSDERMAL | Status: AC
  Filled 2019-10-14: qty 1

## 2019-10-14 MED ORDER — HYDROMORPHONE HCL 1 MG/ML IJ SOLN: 0.2000 mg | INTRAMUSCULAR | Status: DC | PRN

## 2019-10-14 MED ORDER — IBUPROFEN 800 MG PO TABS: 800.0000 mg | ORAL_TABLET | Freq: Three times a day (TID) | ORAL | Status: DC

## 2019-10-14 MED ORDER — HYDROMORPHONE HCL 1 MG/ML IJ SOLN: 0.2500 mg | INTRAMUSCULAR | Status: DC | PRN

## 2019-10-14 MED ORDER — SUGAMMADEX SODIUM 200 MG/2ML IV SOLN
INTRAVENOUS | Status: DC | PRN
Start: 2019-10-14 — End: 2019-10-14
  Administered 2019-10-14: 150 mg via INTRAVENOUS

## 2019-10-14 SURGICAL SUPPLY — 67 items
APPLICATOR ARISTA FLEXITIP XL (MISCELLANEOUS) ×4 IMPLANT
BLADE SURG 10 STRL SS (BLADE) IMPLANT
CABLE HIGH FREQUENCY MONO STRZ (ELECTRODE) IMPLANT
CANISTER SUCT 3000ML PPV (MISCELLANEOUS) ×4 IMPLANT
CELL SAVER LIPIGURD (MISCELLANEOUS) ×2 IMPLANT
COVER BACK TABLE 60X90IN (DRAPES) IMPLANT
COVER MAYO STAND STRL (DRAPES) ×4 IMPLANT
COVER WAND RF STERILE (DRAPES) ×4 IMPLANT
DECANTER SPIKE VIAL GLASS SM (MISCELLANEOUS) ×4 IMPLANT
DERMABOND ADVANCED (GAUZE/BANDAGES/DRESSINGS) ×2
DERMABOND ADVANCED .7 DNX12 (GAUZE/BANDAGES/DRESSINGS) ×2 IMPLANT
DRSG COVADERM PLUS 2X2 (GAUZE/BANDAGES/DRESSINGS) ×12 IMPLANT
DRSG OPSITE POSTOP 3X4 (GAUZE/BANDAGES/DRESSINGS) ×4 IMPLANT
DURAPREP 26ML APPLICATOR (WOUND CARE) ×4 IMPLANT
EXTRT SYSTEM ALEXIS 14CM (MISCELLANEOUS) ×4
EXTRT SYSTEM ALEXIS 17CM (MISCELLANEOUS)
GAUZE 4X4 16PLY RFD (DISPOSABLE) ×4 IMPLANT
GLOVE BIO SURGEON STRL SZ 6.5 (GLOVE) ×6 IMPLANT
GLOVE BIO SURGEONS STRL SZ 6.5 (GLOVE) ×2
GLOVE BIOGEL PI IND STRL 7.0 (GLOVE) ×10 IMPLANT
GLOVE BIOGEL PI INDICATOR 7.0 (GLOVE) ×10
GLOVE ECLIPSE 6.5 STRL STRAW (GLOVE) ×8 IMPLANT
GOWN STRL REUS W/TWL LRG LVL3 (GOWN DISPOSABLE) ×16 IMPLANT
HARMONIC RUM II 2.5CM SILVER (DISPOSABLE) ×4
HARMONIC RUM II 3.0CM SILVER (DISPOSABLE)
HARMONIC RUM II 3.5CM SILVER (DISPOSABLE)
HARMONIC RUM II 4.0CM SILVER (DISPOSABLE)
HEMOSTAT ARISTA ABSORB 3G PWDR (HEMOSTASIS) ×4 IMPLANT
LIGASURE VESSEL 5MM BLUNT TIP (ELECTROSURGICAL) ×4 IMPLANT
NEEDLE INSUFFLATION 120MM (ENDOMECHANICALS) ×4 IMPLANT
PACK LAPAROSCOPY BASIN (CUSTOM PROCEDURE TRAY) ×4 IMPLANT
PACK TRENDGUARD 450 HYBRID PRO (MISCELLANEOUS) IMPLANT
POUCH LAPAROSCOPIC INSTRUMENT (MISCELLANEOUS) ×4 IMPLANT
PROTECTOR NERVE ULNAR (MISCELLANEOUS) ×8 IMPLANT
RETRACTOR WOUND ALXS 19CM XSML (INSTRUMENTS) ×4 IMPLANT
RTRCTR WOUND ALEXIS 19CM XSML (INSTRUMENTS) ×8
SCALPEL HRMNC RUM II 2.5 SILVR (DISPOSABLE) ×2 IMPLANT
SCALPEL HRMNC RUM II 3.0 SILVR (DISPOSABLE) IMPLANT
SCALPEL HRMNC RUM II 3.5 SILVR (DISPOSABLE) IMPLANT
SCALPEL HRMNC RUM II 4.0 SILVR (DISPOSABLE) IMPLANT
SCISSORS LAP 5X35 DISP (ENDOMECHANICALS) IMPLANT
SET IRRIG Y TYPE TUR BLADDER L (SET/KITS/TRAYS/PACK) ×4 IMPLANT
SET SUCTION IRRIG HYDROSURG (IRRIGATION / IRRIGATOR) ×8 IMPLANT
SET TRI-LUMEN FLTR TB AIRSEAL (TUBING) ×4 IMPLANT
SHEARS HARMONIC ACE PLUS 36CM (ENDOMECHANICALS) ×4 IMPLANT
SUT VIC AB 0 CT1 36 (SUTURE) ×4 IMPLANT
SUT VIC AB 0 CT2 27 (SUTURE) ×8 IMPLANT
SUT VIC AB 4-0 PS2 18 (SUTURE) ×12 IMPLANT
SUT VICRYL 0 UR6 27IN ABS (SUTURE) ×8 IMPLANT
SUT VICRYL 4-0 PS2 18IN ABS (SUTURE) ×4 IMPLANT
SUT VLOC 180 0 9IN  GS21 (SUTURE) ×4
SUT VLOC 180 0 9IN GS21 (SUTURE) ×4 IMPLANT
SYR 50ML LL SCALE MARK (SYRINGE) ×8 IMPLANT
SYSTEM CONTND EXTRCTN KII BLLN (MISCELLANEOUS) IMPLANT
TIP RUMI ORANGE 6.7MMX12CM (TIP) ×4 IMPLANT
TIP UTERINE 5.1X6CM LAV DISP (MISCELLANEOUS) IMPLANT
TIP UTERINE 6.7X10CM GRN DISP (MISCELLANEOUS) IMPLANT
TIP UTERINE 6.7X6CM WHT DISP (MISCELLANEOUS) IMPLANT
TIP UTERINE 6.7X8CM BLUE DISP (MISCELLANEOUS) IMPLANT
TOWEL OR 17X26 10 PK STRL BLUE (TOWEL DISPOSABLE) ×4 IMPLANT
TRAY FOLEY W/BAG SLVR 14FR (SET/KITS/TRAYS/PACK) ×4 IMPLANT
TRENDGUARD 450 HYBRID PRO PACK (MISCELLANEOUS)
TROCAR ADV FIXATION 5X100MM (TROCAR) ×4 IMPLANT
TROCAR BLADELESS OPT 5 100 (ENDOMECHANICALS) ×8 IMPLANT
TROCAR PORT AIRSEAL 5X120 (TROCAR) ×4 IMPLANT
TROCAR XCEL NON BLADE 8MM B8LT (ENDOMECHANICALS) ×4 IMPLANT
WARMER LAPAROSCOPE (MISCELLANEOUS) ×4 IMPLANT

## 2019-10-14 NOTE — Discharge Instructions (Signed)
Laparoscopically Assisted Vaginal Hysterectomy, Care After This sheet gives you information about how to care for yourself after your procedure. Your health care provider may also give you more specific instructions. If you have problems or questions, contact your health care provider. What can I expect after the procedure? After the procedure, it is common to have:  Soreness and numbness in your incision areas.  Abdominal pain. You will be given pain medicine to control it.  Vaginal bleeding and discharge. You will need to use a sanitary napkin after this procedure.  Sore throat from the breathing tube that was inserted during surgery. Follow these instructions at home: Medicines  Take over-the-counter and prescription medicines only as told by your health care provider.  Do not take aspirin or ibuprofen. These medicines can cause bleeding.  Do not drive or use heavy machinery while taking prescription pain medicine.  Do not drive for 24 hours if you were given a medicine to help you relax (sedative) during the procedure. Incision care   Follow instructions from your health care provider about how to take care of your incisions. Make sure you: ? Wash your hands with soap and water before you change your bandage (dressing). If soap and water are not available, use hand sanitizer. ? Change your dressing as told by your health care provider. ? Leave stitches (sutures), skin glue, or adhesive strips in place. These skin closures may need to stay in place for 2 weeks or longer. If adhesive strip edges start to loosen and curl up, you may trim the loose edges. Do not remove adhesive strips completely unless your health care provider tells you to do that.  Check your incision area every day for signs of infection. Check for: ? Redness, swelling, or pain. ? Fluid or blood. ? Warmth. ? Pus or a bad smell. Activity  Get regular exercise as told by your health care provider. You may be  told to take short walks every day and go farther each time.  Return to your normal activities as told by your health care provider. Ask your health care provider what activities are safe for you.  Do not douche, use tampons, or have sexual intercourse for at least 6 weeks, or until your health care provider gives you permission.  Do not lift anything that is heavier than 10 lb (4.5 kg), or the limit that your health care provider tells you, until he or she says that it is safe. General instructions  Do not take baths, swim, or use a hot tub until your health care provider approves. Take showers instead of baths.  Do not drive for 24 hours if you received a sedative.  Do not drive or operate heavy machinery while taking prescription pain medicine.  To prevent or treat constipation while you are taking prescription pain medicine, your health care provider may recommend that you: ? Drink enough fluid to keep your urine clear or pale yellow. ? Take over-the-counter or prescription medicines. ? Eat foods that are high in fiber, such as fresh fruits and vegetables, whole grains, and beans. ? Limit foods that are high in fat and processed sugars, such as fried and sweet foods.  Keep all follow-up visits as told by your health care provider. This is important. Contact a health care provider if:  You have signs of infection, such as: ? Redness, swelling, or pain around your incision sites. ? Fluid or blood coming from an incision. ? An incision that feels warm to the   touch. ? Pus or a bad smell coming from an incision.  Your incision breaks open.  Your pain medicine is not helping.  You feel dizzy or light-headed.  You have pain or bleeding when you urinate.  You have persistent nausea and vomiting.  You have blood, pus, or a bad-smelling discharge from your vagina. Get help right away if:  You have a fever.  You have severe abdominal pain.  You have chest pain.  You have  shortness of breath.  You faint.  You have pain, swelling, or redness in your leg.  You have heavy bleeding from your vagina. Summary  After the procedure, it is common to have abdominal pain and vaginal bleeding.  You should not drive or lift heavy objects until your health care provider says that it is safe.  Contact your health care provider if you have any symptoms of infection, excessive vaginal bleeding, nausea, vomiting, or shortness of breath. This information is not intended to replace advice given to you by your health care provider. Make sure you discuss any questions you have with your health care provider. Document Revised: 03/03/2017 Document Reviewed: 05/17/2016 Elsevier Patient Education  2020 [employer]   Post Anesthesia Home Care Instructions  Activity: Get plenty of rest for the remainder of the day. A responsible individual must stay with you for 24 hours following the procedure.  For the next 24 hours, DO NOT: -Drive a car -Operate machinery -Drink alcoholic beverages -Take any medication unless instructed by your physician -Make any legal decisions or sign important papers.  Meals: Start with liquid foods such as gelatin or soup. Progress to regular foods as tolerated. Avoid greasy, spicy, heavy foods. If nausea and/or vomiting occur, drink only clear liquids until the nausea and/or vomiting subsides. Call your physician if vomiting continues.  Special Instructions/Symptoms: Your throat may feel dry or sore from the anesthesia or the breathing tube placed in your throat during surgery. If this causes discomfort, gargle with warm salt water. The discomfort should disappear within 24 hours.  If you had a scopolamine patch placed behind your ear for the management of post- operative nausea and/or vomiting:  1. The medication in the patch is effective for 72 hours, after which it should be removed.  Wrap patch in a tissue and discard in the trash. Wash  hands thoroughly with soap and water. 2. You may remove the patch earlier than 72 hours if you experience unpleasant side effects which may include dry mouth, dizziness or visual disturbances. 3. Avoid touching the patch. Wash your hands with soap and water after contact with the patch.     

## 2019-10-14 NOTE — Anesthesia Postprocedure Evaluation (Signed)
Anesthesia Post Note  Patient: Cathy Austin  Procedure(s) Performed: TOTAL LAPAROSCOPIC HYSTERECTOMY WITH BILATERAL SALPINGECTOMY (N/A Abdomen) CYSTOSCOPY (N/A Bladder)     Patient location during evaluation: PACU Anesthesia Type: General Level of consciousness: oriented, patient cooperative and sedated Pain management: pain level controlled Vital Signs Assessment: post-procedure vital signs reviewed and stable Respiratory status: spontaneous breathing, nonlabored ventilation and respiratory function stable Cardiovascular status: blood pressure returned to baseline and stable Postop Assessment: no apparent nausea or vomiting Anesthetic complications: no   No complications documented.  Last Vitals:  Vitals:   10/14/19 1215 10/14/19 1226  BP:  (!) 145/75  Pulse: 94 (!) 101  Resp: 12 17  Temp:  37 C  SpO2: 100% 100%    Last Pain:  Vitals:   10/14/19 1226  TempSrc:   PainSc: 0-No pain                 Darnell Stimson,E. Hudson Majkowski

## 2019-10-14 NOTE — Progress Notes (Signed)
Pt complains of irritation to left eye, NS wash per pt without relief, anesthesia made aware, in to see pt, orders received.

## 2019-10-14 NOTE — Progress Notes (Signed)
Called to bedside to evaluate painful left eye postoperatively. Nursing staff has flushed the eye without relief, pt still with foreign body sensation in L eye. D/w patient possibility of corneal abrasion and she is agreeable to D/C to home with eye drops with the understanding to call back in 24h if pain is not better as she may need to be evaluated by ophthalmology.

## 2019-10-14 NOTE — Anesthesia Procedure Notes (Signed)
Procedure Name: Intubation Date/Time: 10/14/2019 7:45 AM Performed by: Suan Halter, CRNA Pre-anesthesia Checklist: Patient identified, Emergency Drugs available, Suction available and Patient being monitored Patient Re-evaluated:Patient Re-evaluated prior to induction Oxygen Delivery Method: Circle system utilized Preoxygenation: Pre-oxygenation with 100% oxygen Induction Type: IV induction Ventilation: Mask ventilation without difficulty Laryngoscope Size: Mac and 3 Grade View: Grade I Tube type: Oral Tube size: 7.0 mm Number of attempts: 1 Airway Equipment and Method: Stylet and Oral airway Placement Confirmation: ETT inserted through vocal cords under direct vision,  positive ETCO2 and breath sounds checked- equal and bilateral Secured at: 22 cm Tube secured with: Tape Dental Injury: Teeth and Oropharynx as per pre-operative assessment

## 2019-10-14 NOTE — Interval H&P Note (Signed)
History and Physical Interval Note:  10/14/2019 7:18 AM  Cathy Austin  has presented today for surgery, with the diagnosis of Uterine leiomyoma, unspecified location.  The various methods of treatment have been discussed with the patient and family. After consideration of risks, benefits and other options for treatment, the patient has consented to  Procedure(s) with comments: TOTAL LAPAROSCOPIC HYSTERECTOMY WITH BILATERAL SALPINGECTOMY (N/A) - 3 hours total CYSTOSCOPY (N/A) as a surgical intervention.  The patient's history has been reviewed, patient examined, no change in status, stable for surgery.  I have reviewed the patient's chart and labs.  Questions were answered to the patient's satisfaction.     [name]

## 2019-10-14 NOTE — Op Note (Signed)
Preoperative Diagnosis: Symptomatic fibroid uterus  Postoperative Diagnosis: Same  Procedure:  Total Laparoscopic Hysterectomy with bilateral salipingectomies and cystoscopy  Surgeon: Dr Sumner Boast  Assistant: Dr Edwinna Areola, an MD assistant was necessary for tissue manipulation, retraction and positioning due to the complexity of the case and hospital policies  Anesthesia: General  EBL: 450 cc  Fluids: 2,700 cc LR  Uterine weight: 643 grams  Urine output: 150 cc  Indications for surgery: The patient is a 39 year old female, who presented with a symptomatic fibroid uterus. She desires definitive treatment. Normal preoperative CBC.  The patient is aware of the risks and complications involved with the surgery and consent was obtained prior to the procedure.  Findings: 16 week sized fibroid uterus, normal adnexa bilaterally, normal liver edge, normal bladder mucosa, bilateral ureteral jets noted on cystoscopy.   Procedure: The patient was taken to the operating room with an IV in placed, preoperative antibiotics had been administered. She was placed in the dorsal lithotomy position. General anesthesia was administered. She was prepped and draped in the usual sterile fashion for an abdominal, vaginal surgery. A rumi uterine manipulator was placed, using a # 2.5 cup and a 12 cm extender. A foley catheter was placed.    The umbilicus was everted, injected with 0.25% marcaine and incised with a # 11 blade. 2 towel clips were used to elevated the umbilicus and a veress needle was placed into the abdominal cavity. The abdominal cavity was insufflated with CO2, with normal intraabdominal pressures. After adequate pneumo-insufflation the veress needle was removed and the 5 mm laparoscope was placed into the abdominal cavity using the opti-view trocar. The patient was placed in trendelenburg and the abdominal pelvic cavity was inspected. 3 more trocars were placed: 1 in each lower quadrant  approximately 3 cm medial to and superior to the anterior superior iliac spine and one in the midline approximately 6 cm above the pubic symphysis in the midline. These areas were injected with 0.25% marcaine, incised with a #11 blade and all trocars were inserted with direct visualization with the laparoscope. A # 5 airseal trocar was placed in the RLQ, a 5 mm trocar in the LLQ and a #8 trocar in the midline. The abdominal pelvic cavity was again inspected. A mixture of 30 cc of Robivacaine and 30 cc of NS was place in the pelvic cavity. The tip of uterine manipulator and the balloon were noted to have perforated the left side of the uterus, this area was bleeding, cauterizing lateral to the manipulator didn't stop the bleeding.   The left tube was elevated from the pelvic sidewall, cauterized and cut with the ligasure device. The mesosalpinx was cauterized and cut with the ligasure device. The tube was separated from the uterus using the ligasure device and removed through the midline trocar. The left uterine ovarian ligament was cauterized and cut with the ligasure device. The left round ligament was cauterized and cut with the ligasure device and the anterior and posterior leafs of the broad ligament were taken down with the ligasure device. The harmonic scalpel was then used to take down the bladder flap and skeltonize the vessels. The left uterine vessels were then clamped, cauterized and ligated with the ligasure device. Hemostasis was excellent. The same procedure was repeated on the right. The only bleeding from the procedure came from the perforated uterine wall, the bleeding stopped with ligation of the uterine vessels bilaterally which was quickly accomplished.   Using the [employer] the  uterus was pushed up in the pelvic cavity and the harmonic scalpel was used to separate the cervix from the vagina using the harmonic energy. The uterus was too large and firm to be removed vaginally. An alexis  bag was placed vaginally and the uterus was placed in the bag. An occluder was placed in the vagina to maintain pneumoperitoneum. The vaginal cuff was then closed with a 0 V-lock suture. Hemostasis was excellent. The abdominal pelvic cavity was irrigated and suctioned dry. Pressure was released and hemostasis remained excellent.   The umbilical trocar was removed and the skin incision was extended vertically, the fascial incision was also extended. The end of the bag was brought through the umbilicus and the uterus was morcellated in the bag and removed in pieces. The uterus was very hard and this portion of the case was prolonged. The uterus was removed and the bag remained intact. The fascial incision was closed with a running stitch of 0-Vicryl.   The abdominal cavity was irrigated and suctioned dry. Hemostasis was excellent. Arista was placed in the pelvis.   The abdominal cavity was desufflated and the trocars were removed. The skin was closed with subcuticular stiches of 4-0 vicryl and dermabond was placed over the incisions.  The foley catheter was removed and cystoscopy was performed using a 70 degree scope. Both ureters expelled urine, no bladder abnormalities were noted. The bladder was allowed to drain and the cystoscope was removed.   The patient's abdomen and perineum were cleansed and she was taken out of the dorsal lithotomy position. Upon awakening she was extubated and taken to the recovery room in stable condition. The sponge and instrument counts were correct.   CC: Orma Flaming, MD

## 2019-10-14 NOTE — Discharge Summary (Signed)
Physician Discharge Summary  Patient ID: Cathy Austin MRN: [id number] DOB/AGE: April 13, 1980 39 y.o.  Admit date: 10/14/2019 Discharge date: 10/14/2019  Admission Diagnoses: symptomatic fibroid uterus  Discharge Diagnoses:  Active Problems:   Status post laparoscopic hysterectomy   S/P laparoscopic hysterectomy   Discharged Condition: good  Hospital Course: uncomplicated  Consults: None  Significant Diagnostic Studies: labs:  Lab Results  Component Value Date   WBC 11.6 (H) 10/14/2019   HGB 11.2 (L) 10/14/2019   HCT 33.0 (L) 10/14/2019   MCV 95.1 10/14/2019   PLT 214 10/14/2019     Treatments: surgery: total laparoscopic hysterectomy, bilateral salpingectomies and cystoscopy  Discharge Exam: Blood pressure 120/68, pulse 78, temperature 98.8 F (37.1 C), resp. rate 18, height 5\' 4"  (1.626 m), weight 61.1 kg, last menstrual period 10/03/2019, SpO2 100 %. General appearance: alert, cooperative and no distress Resp: clear to auscultation bilaterally Cardio: S1, S2 normal GI: soft, non-tender; bowel sounds normal; no masses,  no organomegaly Extremities: extremities normal, atraumatic, no cyanosis or edema Incision/Wound: clean dry and intact without erythema.   Disposition: Discharge disposition: 01-Home or Self Care       Discharge Instructions    Call MD for:   Complete by: As directed    Heavy vaginal bleeding   Call MD for:  difficulty breathing, headache or visual disturbances   Complete by: As directed    Call MD for:  hives   Complete by: As directed    Call MD for:  persistant dizziness or light-headedness   Complete by: As directed    Call MD for:  persistant nausea and vomiting   Complete by: As directed    Call MD for:  redness, tenderness, or signs of infection (pain, swelling, redness, odor or green/yellow discharge around incision site)   Complete by: As directed    Call MD for:  severe uncontrolled pain   Complete by: As directed     Call MD for:  temperature >100.4   Complete by: As directed    Diet general   Complete by: As directed    Driving Restrictions   Complete by: As directed    No driving while taking narcotics.   Increase activity slowly   Complete by: As directed    No dressing needed   Complete by: As directed    Sexual Activity Restrictions   Complete by: As directed    No intercourse for 12 weeks     Allergies as of 10/14/2019      Reactions   Clindamycin/lincomycin Cough   Azithromycin Diarrhea   chest tigntness   Guaifenesin    REACTION: throat spasms   Other Itching   Walnut    Penicillins Rash   throat spasm      Medication List    TAKE these medications   acetaminophen 500 MG tablet Commonly known as: TYLENOL Take 2 tablets (1,000 mg total) by mouth every 6 (six) hours.   Biotin 10000 MCG Tabs Take 10,000 mcg by mouth daily.   CANNABIDIOL PO Take 1 drop by mouth daily as needed (pain).   cetirizine 10 MG tablet Commonly known as: ZYRTEC Take 10 mg by mouth daily.   docusate sodium 100 MG capsule Commonly known as: COLACE Take 1 capsule (100 mg total) by mouth 2 (two) times daily.   doxycycline 50 MG tablet Commonly known as: ADOXA Take 50 mg by mouth daily.   fluticasone 50 MCG/ACT nasal spray Commonly known as: FLONASE Place 1 spray into both  nostrils daily.   ibuprofen 800 MG tablet Commonly known as: ADVIL Take 1 tablet (800 mg total) by mouth every 8 (eight) hours.   multivitamin with minerals Tabs tablet Take 1 tablet by mouth daily.   omeprazole 40 MG capsule Commonly known as: PRILOSEC Take 40 mg by mouth daily.   oxyCODONE 5 MG immediate release tablet Commonly known as: Oxy IR/ROXICODONE Take 1-2 tablets (5-10 mg total) by mouth every 4 (four) hours as needed for moderate pain.   psyllium 28 % packet Commonly known as: METAMUCIL SMOOTH TEXTURE Take 1 packet by mouth daily.            Discharge Care Instructions  (From admission,  onward)         Start     Ordered   10/14/19 0000  No dressing needed        10/14/19 1804           Signed: [name] 10/14/2019, 6:04 PM

## 2019-10-14 NOTE — Progress Notes (Signed)
Pt refusing toradol IV states it does not work for her. Will take Ibuprofen 800mg  as prescribed for when Toradol is complete.

## 2019-10-14 NOTE — Addendum Note (Signed)
Addendum  created 10/14/19 1507 by Pervis Hocking, DO   Clinical Note Signed

## 2019-10-14 NOTE — Transfer of Care (Signed)
Immediate Anesthesia Transfer of Care Note  Patient: Rut Betterton  Procedure(s) Performed: Procedure(s) (LRB): TOTAL LAPAROSCOPIC HYSTERECTOMY WITH BILATERAL SALPINGECTOMY (N/A) CYSTOSCOPY (N/A)  Patient Location: PACU  Anesthesia Type: General  Level of Consciousness: awake, oriented, sedated and patient cooperative  Airway & Oxygen Therapy: Patient Spontanous Breathing and Patient connected to face mask oxygen  Post-op Assessment: Report given to PACU RN and Post -op Vital signs reviewed and stable  Post vital signs: Reviewed and stable  Complications: No apparent anesthesia complications Last Vitals:  Vitals Value Taken Time  BP 129/79 10/14/19 1116  Temp    Pulse 94 10/14/19 1119  Resp 12 10/14/19 1119  SpO2 100 % 10/14/19 1119  Vitals shown include unvalidated device data.  Last Pain:  Vitals:   10/14/19 0620  TempSrc: Oral  PainSc: 0-No pain      Patients Stated Pain Goal: 5 (10/14/19 0352)

## 2019-10-15 ENCOUNTER — Encounter (HOSPITAL_BASED_OUTPATIENT_CLINIC_OR_DEPARTMENT_OTHER): Payer: Self-pay | Admitting: Obstetrics and Gynecology

## 2019-10-15 LAB — SURGICAL PATHOLOGY

## 2019-10-29 ENCOUNTER — Encounter: Payer: Self-pay | Admitting: Obstetrics and Gynecology

## 2019-10-29 ENCOUNTER — Ambulatory Visit (INDEPENDENT_AMBULATORY_CARE_PROVIDER_SITE_OTHER): Payer: 59 | Admitting: Obstetrics and Gynecology

## 2019-10-29 ENCOUNTER — Other Ambulatory Visit: Payer: Self-pay

## 2019-10-29 VITALS — BP 104/62 | HR 89 | Ht 64.0 in | Wt 133.0 lb

## 2019-10-29 DIAGNOSIS — B9689 Other specified bacterial agents as the cause of diseases classified elsewhere: Secondary | ICD-10-CM | POA: Diagnosis not present

## 2019-10-29 DIAGNOSIS — Z90710 Acquired absence of both cervix and uterus: Secondary | ICD-10-CM

## 2019-10-29 DIAGNOSIS — Z862 Personal history of diseases of the blood and blood-forming organs and certain disorders involving the immune mechanism: Secondary | ICD-10-CM | POA: Diagnosis not present

## 2019-10-29 DIAGNOSIS — K5900 Constipation, unspecified: Secondary | ICD-10-CM

## 2019-10-29 DIAGNOSIS — N760 Acute vaginitis: Secondary | ICD-10-CM | POA: Diagnosis not present

## 2019-10-29 MED ORDER — METRONIDAZOLE 500 MG PO TABS
500.0000 mg | ORAL_TABLET | Freq: Two times a day (BID) | ORAL | 0 refills | Status: DC
Start: 2019-10-29 — End: 2019-11-26

## 2019-10-29 NOTE — Patient Instructions (Addendum)
Try Magnesium 500 mg a day for constipation  Vaginitis Vaginitis is a condition in which the vaginal tissue swells and becomes red (inflamed). This condition is most often caused by a change in the normal balance of bacteria and yeast that live in the vagina. This change causes an overgrowth of certain bacteria or yeast, which causes the inflammation. There are different types of vaginitis, but the most common types are:  Bacterial vaginosis.  Yeast infection (candidiasis).  Trichomoniasis vaginitis. This is a sexually transmitted disease (STD).  Viral vaginitis.  Atrophic vaginitis.  Allergic vaginitis. What are the causes? The cause of this condition depends on the type of vaginitis. It can be caused by:  Bacteria (bacterial vaginosis).  Yeast, which is a fungus (yeast infection).  A parasite (trichomoniasis vaginitis).  A virus (viral vaginitis).  Low hormone levels (atrophic vaginitis). Low hormone levels can occur during pregnancy, breastfeeding, or after menopause.  Irritants, such as bubble baths, scented tampons, and feminine sprays (allergic vaginitis). Other factors can change the normal balance of the yeast and bacteria that live in the vagina. These include:  Antibiotic medicines.  Poor hygiene.  Diaphragms, vaginal sponges, spermicides, birth control pills, and intrauterine devices (IUD).  Sex.  Infection.  Uncontrolled diabetes.  A weakened defense (immune) system. What increases the risk? This condition is more likely to develop in women who:  Smoke.  Use vaginal douches, scented tampons, or scented sanitary pads.  Wear tight-fitting pants.  Wear thong underwear.  Use oral birth control pills or an IUD.  Have sex without a condom.  Have multiple sex partners.  Have an STD.  Frequently use the spermicide nonoxynol-9.  Eat lots of foods high in sugar.  Have uncontrolled diabetes.  Have low estrogen levels.  Have a weakened immune  system from an immune disorder or medical treatment.  Are pregnant or breastfeeding. What are the signs or symptoms? Symptoms vary depending on the cause of the vaginitis. Common symptoms include:  Abnormal vaginal discharge. ? The discharge is white, gray, or yellow with bacterial vaginosis. ? The discharge is thick, white, and cheesy with a yeast infection. ? The discharge is frothy and yellow or greenish with trichomoniasis.  A bad vaginal smell. The smell is fishy with bacterial vaginosis.  Vaginal itching, pain, or swelling.  Sex that is painful.  Pain or burning when urinating. Sometimes there are no symptoms. How is this diagnosed? This condition is diagnosed based on your symptoms and medical history. A physical exam, including a pelvic exam, will also be done. You may also have other tests, including:  Tests to determine the pH level (acidity or alkalinity) of your vagina.  A whiff test, to assess the odor that results when a sample of your vaginal discharge is mixed with a potassium hydroxide solution.  Tests of vaginal fluid. A sample will be examined under a microscope. How is this treated? Treatment varies depending on the type of vaginitis you have. Your treatment may include:  Antibiotic creams or pills to treat bacterial vaginosis and trichomoniasis.  Antifungal medicines, such as vaginal creams or suppositories, to treat a yeast infection.  Medicine to ease discomfort if you have viral vaginitis. Your sexual partner should also be treated.  Estrogen delivered in a cream, pill, suppository, or vaginal ring to treat atrophic vaginitis. If vaginal dryness occurs, lubricants and moisturizing creams may help. You may need to avoid scented soaps, sprays, or douches.  Stopping use of a product that is causing allergic vaginitis. Then  using a vaginal cream to treat the symptoms. Follow these instructions at home: Lifestyle  Keep your genital area clean and dry.  Avoid soap, and only rinse the area with water.  Do not douche or use tampons until your health care provider says it is okay to do so. Use sanitary pads, if needed.  Do not have sex until your health care provider approves. When you can return to sex, practice safe sex and use condoms.  Wipe from front to back. This avoids the spread of bacteria from the rectum to the vagina. General instructions  Take over-the-counter and prescription medicines only as told by your health care provider.  If you were prescribed an antibiotic medicine, take or use it as told by your health care provider. Do not stop taking or using the antibiotic even if you start to feel better.  Keep all follow-up visits as told by your health care provider. This is important. How is this prevented?  Use mild, non-scented products. Do not use things that can irritate the vagina, such as fabric softeners. Avoid the following products if they are scented: ? Feminine sprays. ? Detergents. ? Tampons. ? Feminine hygiene products. ? Soaps or bubble baths.  Let air reach your genital area. ? Wear cotton underwear to reduce moisture buildup. ? Avoid wearing underwear while you sleep. ? Avoid wearing tight pants and underwear or nylons without a cotton panel. ? Avoid wearing thong underwear.  Take off any wet clothing, such as bathing suits, as soon as possible.  Practice safe sex and use condoms. Contact a health care provider if:  You have abdominal pain.  You have a fever.  You have symptoms that last for more than 2-3 days. Get help right away if:  You have a fever and your symptoms suddenly get worse. Summary  Vaginitis is a condition in which the vaginal tissue becomes inflamed.This condition is most often caused by a change in the normal balance of bacteria and yeast that live in the vagina.  Treatment varies depending on the type of vaginitis you have.  Do not douche, use tampons , or have sex until  your health care provider approves. When you can return to sex, practice safe sex and use condoms. This information is not intended to replace advice given to you by your health care provider. Make sure you discuss any questions you have with your health care provider. Document Revised: 03/03/2017 Document Reviewed: 04/26/2016 Elsevier Patient Education  2020 [employer].

## 2019-10-29 NOTE — Progress Notes (Signed)
GYNECOLOGY  VISIT   HPI: 39 y.o.   Married   Other or two or more races White or Caucasian Hispanic or Latino  female   [id number] with Patient's last menstrual period was 10/03/2019.   here for 2 week post opt visit following total laparoscopic hysterectomy with bilateral salpingectomies for a symptomatic fibroid uterus. 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   She c/o intermittent pelvic cramping that started a few days ago. Some constipation (not a change). She is taking fiber, colace. Voiding fine, improved since surgery.   She does c/o having some vaginal discharge for the last 3 days. Discharge is clear and sometimes white. Slight intermittent odor. No itching, burning or irritation.   She was noted to have a low calcium level preop.   GYNECOLOGIC HISTORY: Patient's last menstrual period was 10/03/2019. Contraception:Hysterectomy  Menopausal hormone therapy: none         OB History    Gravida  0   Para  0   Term  0   Preterm  0   AB  0   Living  0     SAB  0   TAB  0   Ectopic  0   Multiple  0   Live Births                 Patient Active Problem List   Diagnosis Date Noted  . S/P laparoscopic hysterectomy 10/14/2019  . Abnormal Pap smear of cervix 08/21/2019  . IBS (irritable bowel syndrome) 07/24/2019  . ASTHMA, EXERCISE INDUCED 04/09/2009  . GERD 04/09/2009    Past Medical History:  Diagnosis Date  . Abnormal uterine bleeding   . Allergy   . Anxiety   . Arthritis    hands, feet, back  . Asthma   . ASTHMA, EXERCISE INDUCED 04/09/2009  . Family history of adverse reaction to anesthesia    N&V  . GERD 04/09/2009  . GERD (gastroesophageal reflux disease)   . 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 COLPOSCOPY    . CYSTOSCOPY N/A 10/14/2019   Procedure: CYSTOSCOPY;  Surgeon: [name], MD;  Location: [facility];  Service: Gynecology;  Laterality:  N/A;  . TOTAL LAPAROSCOPIC HYSTERECTOMY WITH SALPINGECTOMY N/A 10/14/2019   Procedure: TOTAL LAPAROSCOPIC HYSTERECTOMY WITH BILATERAL SALPINGECTOMY;  Surgeon: [name], MD;  Location: [facility];  Service: Gynecology;  Laterality: N/A;  3 hours total  . WISDOM TOOTH EXTRACTION  Highschool    Current Outpatient Medications  Medication Sig Dispense Refill  . acetaminophen (TYLENOL) 500 MG tablet Take 2 tablets (1,000 mg total) by mouth every 6 (six) hours. 30 tablet 0  . Biotin 10000 MCG TABS Take 10,000 mcg by mouth daily.    Marland Kitchen CANNABIDIOL PO Take 1 drop by mouth daily as needed (pain).    . cetirizine (ZYRTEC) 10 MG tablet Take 10 mg by mouth daily.    Marland Kitchen docusate sodium (COLACE) 100 MG capsule Take 1 capsule (100 mg total) by mouth 2 (two) times daily. 30 capsule 0  . doxycycline (ADOXA) 50 MG tablet Take 50 mg by mouth daily.    . fluticasone (FLONASE) 50 MCG/ACT nasal spray Place 1 spray into both nostrils daily.    Marland Kitchen ibuprofen (ADVIL) 800 MG tablet Take 1 tablet (800 mg total) by mouth every 8 (eight) hours. 30 tablet 0  . Multiple Vitamin (MULTIVITAMIN WITH MINERALS)  TABS tablet Take 1 tablet by mouth daily.    Marland Kitchen omeprazole (PRILOSEC) 40 MG capsule Take 40 mg by mouth daily.    . psyllium (METAMUCIL SMOOTH TEXTURE) 28 % packet Take 1 packet by mouth daily.     No current facility-administered medications for this visit.     ALLERGIES: Clindamycin/lincomycin, Azithromycin, Guaifenesin, Other, and Penicillins  Family History  Problem Relation Age of Onset  . Arthritis Mother   . Hyperlipidemia Mother   . Melanoma Mother   . Alcohol abuse Father        grandparents, aunt  . Hyperlipidemia Father        grandparents  . Hypertension Father   . Diabetes Father   . Arthritis Father   . Breast cancer Other        materna-grandmother, maternal aunt  . Crohn's disease Sister     Social History   Socioeconomic History  . Marital status: Married     Spouse name: Not on file  . Number of children: Not on file  . Years of education: Not on file  . Highest education level: Not on file  Occupational History  . Not on file  Tobacco Use  . Smoking status: Never Smoker  . Smokeless tobacco: Never Used  Vaping Use  . Vaping Use: Never used  Substance and Sexual Activity  . Alcohol use: Yes    Alcohol/week: 1.0 standard drink    Types: 1 Cans of beer per week    Comment: occ  . Drug use: No  . Sexual activity: Yes    Birth control/protection: Other-see comments    Comment: Husband had vasectomy   Other Topics Concern  . Not on file  Social History Narrative  . Not on file   Social Determinants of Health   Financial Resource Strain:   . Difficulty of Paying Living Expenses:   Food Insecurity:   . Worried About Charity fundraiser in the Last Year:   . Arboriculturist in the Last Year:   Transportation Needs:   . Film/video editor (Medical):   Marland Kitchen Lack of Transportation (Non-Medical):   Physical Activity:   . Days of Exercise per Week:   . Minutes of Exercise per Session:   Stress:   . Feeling of Stress :   Social Connections:   . Frequency of Communication with Friends and Family:   . Frequency of Social Gatherings with Friends and Family:   . Attends Religious Services:   . Active Member of Clubs or Organizations:   . Attends Archivist Meetings:   Marland Kitchen Marital Status:   Intimate Partner Violence:   . Fear of Current or Ex-Partner:   . Emotionally Abused:   Marland Kitchen Physically Abused:   . Sexually Abused:     Review of Systems  All other systems reviewed and are negative.   PHYSICAL EXAMINATION:    BP (!) 104/62   Pulse 89   Ht 5\' 4"  (1.626 m)   Wt 133 lb (60.3 kg)   LMP 10/03/2019   SpO2 99%   BMI 22.83 kg/m     General appearance: alert, cooperative and appears stated age Abdomen: soft, non-tender; non distended, no masses,  no organomegaly Incisions: the left lower quadrant incision has a small  hematoma, no signs of infection. Other incisions are healing well.   Pelvic: External genitalia:  no lesions              Urethra:  normal  appearing urethra with no masses, tenderness or lesions              Bartholins and Skenes: normal                 Vagina: normal appearing vagina with a slight amount of watery, brown tinged vaginal d/c              Cervix: absent   Vaginal cuff: healing well, no granulation tissue seen, not tender              Bimanual Exam:  Uterus:  uterus absent              Adnexa: no mass, fullness, tenderness                Chaperone was present for exam.  Wet prep: + clue, no trich, rare wbc KOH: no yeast PH: 5   ASSESSMENT Post op check, overall during well  Some pelvic cramping in the last few days, normal BM exam, could be from constipation BV Low calcium level preoperatively  Mild anemia after surgery    PLAN Call with worsening symptoms Flagyl for BV F/U in 2 weeks CBC, Ferritin, Calcium, PTH, vit d

## 2019-10-30 LAB — CBC
Hematocrit: 37.9 % (ref 34.0–46.6)
Hemoglobin: 12.6 g/dL (ref 11.1–15.9)
MCH: 32 pg (ref 26.6–33.0)
MCHC: 33.2 g/dL (ref 31.5–35.7)
MCV: 96 fL (ref 79–97)
Platelets: 465 10*3/uL — ABNORMAL HIGH (ref 150–450)
RBC: 3.94 x10E6/uL (ref 3.77–5.28)
RDW: 11.5 % — ABNORMAL LOW (ref 11.7–15.4)
WBC: 7.3 10*3/uL (ref 3.4–10.8)

## 2019-10-30 LAB — VITAMIN D 25 HYDROXY (VIT D DEFICIENCY, FRACTURES): Vit D, 25-Hydroxy: 34.3 ng/mL (ref 30.0–100.0)

## 2019-10-30 LAB — FERRITIN: Ferritin: 50 ng/mL (ref 15–150)

## 2019-10-30 LAB — PTH, INTACT AND CALCIUM
Calcium: 9.4 mg/dL (ref 8.7–10.2)
PTH: 24 pg/mL (ref 15–65)

## 2019-11-26 ENCOUNTER — Encounter: Payer: Self-pay | Admitting: Obstetrics and Gynecology

## 2019-11-26 ENCOUNTER — Other Ambulatory Visit: Payer: Self-pay

## 2019-11-26 ENCOUNTER — Ambulatory Visit (INDEPENDENT_AMBULATORY_CARE_PROVIDER_SITE_OTHER): Payer: No Typology Code available for payment source | Admitting: Obstetrics and Gynecology

## 2019-11-26 VITALS — BP 126/64 | HR 82 | Ht 64.0 in | Wt 137.0 lb

## 2019-11-26 DIAGNOSIS — Z90710 Acquired absence of both cervix and uterus: Secondary | ICD-10-CM

## 2019-11-26 NOTE — Progress Notes (Signed)
GYNECOLOGY  VISIT   HPI: 39 y.o.   Married   Other or two or more races White or Caucasian Hispanic or Latino  female   [place] with Patient's last menstrual period was 10/03/2019.   here for 6 week post opt from hysterectomy. She states that she is tired after going back to work yesterday. Normal CBC at her 2 week post op visit.  Otherwise doing fine. She has a BM every other day, no change, always hard. She does a fiber blend and supplemental oils.  Voiding fine, no bleeding.   GYNECOLOGIC HISTORY: Patient's last menstrual period was 10/03/2019. Contraception:none  Menopausal hormone therapy: none        OB History    Gravida  0   Para  0   Term  0   Preterm  0   AB  0   Living  0     SAB  0   TAB  0   Ectopic  0   Multiple  0   Live Births                 Patient Active Problem List   Diagnosis Date Noted  . S/P laparoscopic hysterectomy 10/14/2019  . Abnormal Pap smear of cervix 08/21/2019  . IBS (irritable bowel syndrome) 07/24/2019  . ASTHMA, EXERCISE INDUCED 04/09/2009  . GERD 04/09/2009    Past Medical History:  Diagnosis Date  . Abnormal uterine bleeding   . Allergy   . Anxiety   . Arthritis    hands, feet, back  . Asthma   . ASTHMA, EXERCISE INDUCED 04/09/2009  . Family history of adverse reaction to anesthesia    N&V  . GERD 04/09/2009  . GERD (gastroesophageal reflux disease)   . 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 COLPOSCOPY    . CYSTOSCOPY N/A 10/14/2019   Procedure: CYSTOSCOPY;  Surgeon: [name], MD;  Location: [facility];  Service: Gynecology;  Laterality: N/A;  . TOTAL LAPAROSCOPIC HYSTERECTOMY WITH SALPINGECTOMY N/A 10/14/2019   Procedure: TOTAL LAPAROSCOPIC HYSTERECTOMY WITH BILATERAL SALPINGECTOMY;  Surgeon: [name], MD;  Location: [facility];  Service: Gynecology;  Laterality: N/A;  3 hours total  .  WISDOM TOOTH EXTRACTION  Highschool    Current Outpatient Medications  Medication Sig Dispense Refill  . acetaminophen (TYLENOL) 500 MG tablet Take 2 tablets (1,000 mg total) by mouth every 6 (six) hours. 30 tablet 0  . Biotin 10000 MCG TABS Take 10,000 mcg by mouth daily.    Marland Kitchen CANNABIDIOL PO Take 1 drop by mouth daily as needed (pain).    . cetirizine (ZYRTEC) 10 MG tablet Take 10 mg by mouth daily.    Marland Kitchen docusate sodium (COLACE) 100 MG capsule Take 1 capsule (100 mg total) by mouth 2 (two) times daily. 30 capsule 0  . doxycycline (ADOXA) 50 MG tablet Take 50 mg by mouth daily.    . fluticasone (FLONASE) 50 MCG/ACT nasal spray Place 1 spray into both nostrils daily.    Marland Kitchen ibuprofen (ADVIL) 800 MG tablet Take 1 tablet (800 mg total) by mouth every 8 (eight) hours. 30 tablet 0  . Multiple Vitamin (MULTIVITAMIN WITH MINERALS) TABS tablet Take 1 tablet by mouth daily.    Marland Kitchen omeprazole (PRILOSEC) 40 MG capsule Take 40 mg by mouth daily.    . psyllium (METAMUCIL SMOOTH TEXTURE) 28 % packet Take 1 packet by mouth  daily.     No current facility-administered medications for this visit.     ALLERGIES: Clindamycin/lincomycin, Azithromycin, Guaifenesin, Other, and Penicillins  Family History  Problem Relation Age of Onset  . Arthritis Mother   . Hyperlipidemia Mother   . Melanoma Mother   . Alcohol abuse Father        grandparents, aunt  . Hyperlipidemia Father        grandparents  . Hypertension Father   . Diabetes Father   . Arthritis Father   . Breast cancer Other        materna-grandmother, maternal aunt  . Crohn's disease Sister     Social History   Socioeconomic History  . Marital status: Married    Spouse name: Not on file  . Number of children: Not on file  . Years of education: Not on file  . Highest education level: Not on file  Occupational History  . Not on file  Tobacco Use  . Smoking status: Never Smoker  . Smokeless tobacco: Never Used  Vaping Use  . Vaping Use:  Never used  Substance and Sexual Activity  . Alcohol use: Yes    Alcohol/week: 1.0 standard drink    Types: 1 Cans of beer per week    Comment: occ  . Drug use: No  . Sexual activity: Yes    Birth control/protection: Other-see comments    Comment: Husband had vasectomy   Other Topics Concern  . Not on file  Social History Narrative  . Not on file   Social Determinants of Health   Financial Resource Strain:   . Difficulty of Paying Living Expenses: Not on file  Food Insecurity:   . Worried About Charity fundraiser in the Last Year: Not on file  . Ran Out of Food in the Last Year: Not on file  Transportation Needs:   . Lack of Transportation (Medical): Not on file  . Lack of Transportation (Non-Medical): Not on file  Physical Activity:   . Days of Exercise per Week: Not on file  . Minutes of Exercise per Session: Not on file  Stress:   . Feeling of Stress : Not on file  Social Connections:   . Frequency of Communication with Friends and Family: Not on file  . Frequency of Social Gatherings with Friends and Family: Not on file  . Attends Religious Services: Not on file  . Active Member of Clubs or Organizations: Not on file  . Attends Archivist Meetings: Not on file  . Marital Status: Not on file  Intimate Partner Violence:   . Fear of Current or Ex-Partner: Not on file  . Emotionally Abused: Not on file  . Physically Abused: Not on file  . Sexually Abused: Not on file    Review of Systems  All other systems reviewed and are negative.   PHYSICAL EXAMINATION:    BP 126/64   Pulse 82   Ht 5\' 4"  (1.626 m)   Wt 137 lb (62.1 kg)   LMP 10/03/2019   SpO2 99%   BMI 23.52 kg/m     General appearance: alert, cooperative and appears stated age Abdomen: soft, non-tender; non distended, no masses,  no organomegaly. Incisions: healing well.  Pelvic: External genitalia:  no lesions              Urethra:  normal appearing urethra with no masses, tenderness or  lesions  Bartholins and Skenes: normal                 Vagina: normal appearing vagina with normal color and discharge, no lesions              Cervix: absent              Bimanual Exam:  Uterus:  uterus absent              Adnexa: no mass, fullness, tenderness                Chaperone was present for exam.  ASSESSMENT 6 weeks s/p TLH, healing well H/O CIN I and VAIN I    PLAN Pelvic rest until 12 weeks post op F/u for annual in 4/21, needs f/u of VAIN   CC: Dr Rogers Blocker

## 2020-01-02 ENCOUNTER — Ambulatory Visit (INDEPENDENT_AMBULATORY_CARE_PROVIDER_SITE_OTHER): Payer: No Typology Code available for payment source | Admitting: Podiatry

## 2020-01-02 ENCOUNTER — Encounter: Payer: Self-pay | Admitting: Podiatry

## 2020-01-02 ENCOUNTER — Other Ambulatory Visit: Payer: Self-pay

## 2020-01-02 DIAGNOSIS — L603 Nail dystrophy: Secondary | ICD-10-CM

## 2020-01-02 DIAGNOSIS — L661 Lichen planopilaris: Secondary | ICD-10-CM | POA: Insufficient documentation

## 2020-01-02 NOTE — Progress Notes (Signed)
Subjective:  Patient ID: Cathy Austin, female    DOB: February 08, 1981,  MRN: [id number] HPI Chief Complaint  Patient presents with  . Nail Problem    Hallux and 3rd toenail left - injury to 1st toenail in elementary school, got fungus and had since, dermatologist didn't do culture, but Rx'd fluconazole - no help, using tea tree oil at home  . New Patient (Initial Visit)    39 y.o. female presents with the above complaint.   ROS: Denies fever chills nausea vomiting muscle aches pains calf pain back pain chest pain shortness of breath.  Past Medical History:  Diagnosis Date  . Abnormal uterine bleeding   . Allergy   . Anxiety   . Arthritis    hands, feet, back  . Asthma   . ASTHMA, EXERCISE INDUCED 04/09/2009  . Family history of adverse reaction to anesthesia    N&V  . GERD 04/09/2009  . GERD (gastroesophageal reflux disease)   . 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 COLPOSCOPY    . CYSTOSCOPY N/A 10/14/2019   Procedure: CYSTOSCOPY;  Surgeon: [name], MD;  Location: [facility];  Service: Gynecology;  Laterality: N/A;  . TOTAL LAPAROSCOPIC HYSTERECTOMY WITH SALPINGECTOMY N/A 10/14/2019   Procedure: TOTAL LAPAROSCOPIC HYSTERECTOMY WITH BILATERAL SALPINGECTOMY;  Surgeon: [name], MD;  Location: [facility];  Service: Gynecology;  Laterality: N/A;  3 hours total  . WISDOM TOOTH EXTRACTION  Highschool    Current Outpatient Medications:  .  acetaminophen (TYLENOL) 500 MG tablet, Take 2 tablets (1,000 mg total) by mouth every 6 (six) hours., Disp: 30 tablet, Rfl: 0 .  Biotin 10000 MCG TABS, Take 10,000 mcg by mouth daily., Disp: , Rfl:  .  CANNABIDIOL PO, Take 1 drop by mouth daily as needed (pain)., Disp: , Rfl:  .  cetirizine (ZYRTEC) 10 MG tablet, Take 10 mg by mouth daily., Disp: , Rfl:  .  docusate sodium (COLACE) 100 MG capsule, Take 1 capsule (100 mg  total) by mouth 2 (two) times daily., Disp: 30 capsule, Rfl: 0 .  doxycycline (ADOXA) 50 MG tablet, Take 50 mg by mouth daily., Disp: , Rfl:  .  fluticasone (FLONASE) 50 MCG/ACT nasal spray, Place 1 spray into both nostrils daily., Disp: , Rfl:  .  ibuprofen (ADVIL) 800 MG tablet, Take 1 tablet (800 mg total) by mouth every 8 (eight) hours., Disp: 30 tablet, Rfl: 0 .  Multiple Vitamin (MULTIVITAMIN WITH MINERALS) TABS tablet, Take 1 tablet by mouth daily., Disp: , Rfl:  .  omeprazole (PRILOSEC) 40 MG capsule, Take 40 mg by mouth daily., Disp: , Rfl:  .  psyllium (METAMUCIL SMOOTH TEXTURE) 28 % packet, Take 1 packet by mouth daily., Disp: , Rfl:   Allergies  Allergen Reactions  . Clindamycin/Lincomycin Cough  . Azithromycin Diarrhea    chest tigntness  . Guaifenesin     REACTION: throat spasms  . Other Itching    [place]   . Penicillins Rash    throat spasm   Review of Systems Objective:  There were no vitals filed for this visit.  General: Well developed, nourished, in no acute distress, alert and oriented x3   Dermatological: Skin is warm, dry and supple bilateral. Nails x 10 are well maintained; remaining integument appears unremarkable at this time. There are no open sores, no preulcerative lesions, no rash or signs of infection present.  Hallux left  and third digit left demonstrates some thickening of the toenail there is a powdery breakdown of the nail plate itself.  It is very friable discolored and painful.  Vascular: Dorsalis Pedis artery and Posterior Tibial artery pedal pulses are 2/4 bilateral with immedate capillary fill time. Pedal hair growth present. No varicosities and no lower extremity edema present bilateral.   Neruologic: Grossly intact via light touch bilateral. Vibratory intact via tuning fork bilateral. Protective threshold with Semmes Wienstein monofilament intact to all pedal sites bilateral. Patellar and Achilles deep tendon reflexes 2+ bilateral. No Babinski or  clonus noted bilateral.   Musculoskeletal: No gross boney pedal deformities bilateral. No pain, crepitus, or limitation noted with foot and ankle range of motion bilateral. Muscular strength 5/5 in all groups tested bilateral.  Gait: Unassisted, Nonantalgic.    Radiographs:  None taken  Assessment & Plan:   Assessment: Nail dystrophy first and third left possible onychomycosis.  Plan: Samples of the skin and nail were taken today to be sent for pathologic evaluation I will follow-up with her in 1 month     Cathy Austin, [place]

## 2020-01-20 ENCOUNTER — Other Ambulatory Visit: Payer: Self-pay

## 2020-01-20 ENCOUNTER — Ambulatory Visit (INDEPENDENT_AMBULATORY_CARE_PROVIDER_SITE_OTHER): Payer: No Typology Code available for payment source | Admitting: Family Medicine

## 2020-01-20 ENCOUNTER — Encounter: Payer: Self-pay | Admitting: Family Medicine

## 2020-01-20 VITALS — BP 112/78 | HR 86 | Temp 98.0°F | Ht 64.0 in | Wt 139.6 lb

## 2020-01-20 DIAGNOSIS — R21 Rash and other nonspecific skin eruption: Secondary | ICD-10-CM | POA: Diagnosis not present

## 2020-01-20 DIAGNOSIS — T50B95A Adverse effect of other viral vaccines, initial encounter: Secondary | ICD-10-CM

## 2020-01-20 DIAGNOSIS — F411 Generalized anxiety disorder: Secondary | ICD-10-CM

## 2020-01-20 DIAGNOSIS — Z1152 Encounter for screening for COVID-19: Secondary | ICD-10-CM

## 2020-01-20 MED ORDER — BUSPIRONE HCL 7.5 MG PO TABS: 7.5000 mg | ORAL_TABLET | Freq: Three times a day (TID) | ORAL | 1 refills | Status: DC

## 2020-01-20 MED ORDER — HYDROXYZINE HCL 25 MG PO TABS: 25.0000 mg | ORAL_TABLET | Freq: Three times a day (TID) | ORAL | 1 refills | Status: DC | PRN

## 2020-01-20 NOTE — Progress Notes (Signed)
Patient: Cathy Austin MRN: [id number] DOB: 1980-12-11 PCP: Orma Flaming, MD     Subjective:  Chief Complaint  Patient presents with  . Anxiety  . Chest Pain    Pt complians of chest heaviness, but says that she was recently vaccinated.   . Rash    Pt complains of a rash, at the crown of her head.    HPI: The patient is a 39 y.o. female who presents today for Anxiety and side effects of the covid vaccine.   Anxiety She has had symptoms of anxiety "forever." situation at work and covid is making this worse. She is on edge and knows she needs something. She was on zoloft and had hallucination and suicide ideation. She still has night terrors and doesn't want to set this off. She is not really interested in counseling either. She is exercising and following a healthy diet. She knows a big source of her anxiety is her job and covid. She is trying to get back in bible study as this helps her as well. Not full blown panic attacks, but has episodes of more severe anxiety.   Had her first Garvin shot last Thursday on 01/16/20. Since that time she has had chest pain, rash on head, shedding of epithelium of mouth, tachycardia. The chest pain comes and goes. She doesn't seem to have pain with exertion. She did heavy exercise yesterday and was fine, but feels awful today. She did have some pain up her jaw, but only once and has been fine since that time. No shortness of breath, diaphoresis, radiation down arm.  She has had covid and has natural immunity and is fearful of getting the second vaccine.   Review of Systems  Constitutional: Negative for chills, fatigue and fever.  Respiratory: Negative for cough, chest tightness and shortness of breath.   Cardiovascular: Positive for chest pain. Negative for palpitations and leg swelling.  Skin: Positive for rash.  Psychiatric/Behavioral: Positive for sleep disturbance. Negative for dysphoric mood and suicidal ideas. The patient is  nervous/anxious.     Allergies Patient is allergic to clindamycin/lincomycin, azithromycin, guaifenesin, other, and penicillins.  Past Medical History Patient  has a past medical history of Abnormal uterine bleeding, Allergy, Anxiety, Arthritis, Asthma, ASTHMA, EXERCISE INDUCED (04/09/2009), Family history of adverse reaction to anesthesia, GERD (04/09/2009), GERD (gastroesophageal reflux disease), and STD (sexually transmitted disease) (2014).  Surgical History Patient  has a past surgical history that includes Wisdom tooth extraction (Highschool); Colposcopy; Total laparoscopic hysterectomy with salpingectomy (N/A, 10/14/2019); and Cystoscopy (N/A, 10/14/2019).  Family History Pateint's family history includes Alcohol abuse in her father; Arthritis in her father and mother; Breast cancer in an other family member; Crohn's disease in her sister; Diabetes in her father; Hyperlipidemia in her father and mother; Hypertension in her father; Melanoma in her mother.  Social History Patient  reports that she has never smoked. She has never used smokeless tobacco. She reports current alcohol use of about 1.0 standard drink of alcohol per week. She reports that she does not use drugs.    Objective: Vitals:   01/20/20 1350  BP: 112/78  Pulse: 86  Temp: 98 F (36.7 C)  TempSrc: Temporal  SpO2: 99%  Weight: 139 lb 9.6 oz (63.3 kg)  Height: 5\' 4"  (1.626 m)    Body mass index is 23.96 kg/m.  Physical Exam Vitals reviewed.  Constitutional:      Appearance: She is well-developed and normal weight.  HENT:     Head: Normocephalic and  atraumatic.  Eyes:     Extraocular Movements: Extraocular movements intact.     Pupils: Pupils are equal, round, and reactive to light.  Cardiovascular:     Rate and Rhythm: Normal rate and regular rhythm.     Heart sounds: Normal heart sounds. No murmur heard.   Pulmonary:     Effort: Pulmonary effort is normal.     Breath sounds: Normal breath sounds.   Chest:     Chest wall: No tenderness.  Abdominal:     General: Bowel sounds are normal.     Palpations: Abdomen is soft.  Musculoskeletal:     Cervical back: Normal range of motion and neck supple.  Lymphadenopathy:     Cervical: No cervical adenopathy.  Skin:    Capillary Refill: Capillary refill takes less than 2 seconds.  Neurological:     General: No focal deficit present.     Mental Status: She is alert and oriented to person, place, and time.  Psychiatric:        Mood and Affect: Mood normal.        Behavior: Behavior normal.    GAD 7 : Generalized Anxiety Score 01/20/2020  Nervous, Anxious, on Edge 1  Control/stop worrying 1  Worry too much - different things 2  Trouble relaxing 2  Restless 2  Easily annoyed or irritable 3  Afraid - awful might happen 2  Total GAD 7 Score 13  Anxiety Difficulty Somewhat difficult   Ekg: nsr with rate of 86. One PVC.      Assessment/plan: 1. GAD (generalized anxiety disorder) GAD 7 is moderate and she is really struggling. Does not want SSRI after experience with zoloft. Does not want counseling. Will do exercise as this really helps once cleared from work up, bible study and start her on buspar. Discussed how to take this, side effects. Will have close f/u with her in one month and she is to let me know if any issues. Any si/hi/ah/vh she is to call 911 or go to ER.   2. Adverse effect of mRNA COVID-19 vaccine ekg reassuring. No new BBB. Checking troponin, inflammatory markers and asked her to not work out hard until we get this back. With multiple symptoms from vaccine including rash on head, chest pain (does not appear to be myocarditis), anxiety, mouth symptoms and the fact she has natural immunity from covid I will write letter requesting she not get second vaccination due to risk of more side effects and because she has natural immunity plus one mrna vaccination which should provide her durable/lasting immunity from multiple  studies.     This visit occurred during the SARS-CoV-2 public health emergency.  Safety protocols were in place, including screening questions prior to the visit, additional usage of staff PPE, and extensive cleaning of exam room while observing appropriate contact time as indicated for disinfecting solutions.      Return in about 1 month (around 02/20/2020) for anxiety .   Orma Flaming, MD [place]   01/20/2020

## 2020-01-20 NOTE — Patient Instructions (Addendum)
-  let's start you on buspar for anxiety. You can take it three times a day, but I typically start you at twice a day. Max dose is 30mg /day so you may find you need 15mg  twice a day or just 15mg  in the am and 7.5mg  at night. You can play around with dosing. Please let me know if any issues.   Also sending in hydroxyzine for you to take prn for severe anxiety. May make you sleepy.   -ill write you letter for allergic reaction to vaccine.   -ruling out myocarditis, but low threshold for this. Ekg/labs today. Worsening pain or shortness of breath: ER

## 2020-01-21 LAB — TROPONIN I: Troponin I: <3 ng/L (ref <=47)

## 2020-01-21 LAB — C-REACTIVE PROTEIN: CRP: 9.9 mg/L — ABNORMAL HIGH (ref <8.0)

## 2020-01-21 LAB — SARS COV-2 SEROLOGY(COVID-19)AB(IGG,IGM),IMMUNOASSAY
SARS CoV-2 AB IgG: NEGATIVE
SARS CoV-2 IgM: NEGATIVE

## 2020-01-21 LAB — CBC WITH DIFFERENTIAL/PLATELET
Absolute Monocytes: 504 cells/uL (ref 200–950)
Basophils Absolute: 29 cells/uL (ref 0–200)
Basophils Relative: 0.6 %
Eosinophils Absolute: 82 cells/uL (ref 15–500)
Eosinophils Relative: 1.7 %
HCT: 39.2 % (ref 35.0–45.0)
Hemoglobin: 13 g/dL (ref 11.7–15.5)
Lymphs Abs: 2045 cells/uL (ref 850–3900)
MCH: 31.1 pg (ref 27.0–33.0)
MCHC: 33.2 g/dL (ref 32.0–36.0)
MCV: 93.8 fL (ref 80.0–100.0)
MPV: 9 fL (ref 7.5–12.5)
Monocytes Relative: 10.5 %
Neutro Abs: 2141 cells/uL (ref 1500–7800)
Neutrophils Relative %: 44.6 %
Platelets: 271 10*3/uL (ref 140–400)
RBC: 4.18 10*6/uL (ref 3.80–5.10)
RDW: 11.5 % (ref 11.0–15.0)
Total Lymphocyte: 42.6 %
WBC: 4.8 10*3/uL (ref 3.8–10.8)

## 2020-01-21 LAB — SEDIMENTATION RATE: Sed Rate: 6 mm/h (ref 0–20)

## 2020-01-24 ENCOUNTER — Encounter: Payer: Self-pay | Admitting: Family Medicine

## 2020-01-24 ENCOUNTER — Other Ambulatory Visit: Payer: Self-pay | Admitting: Family Medicine

## 2020-02-06 ENCOUNTER — Encounter: Payer: Self-pay | Admitting: Podiatry

## 2020-02-06 ENCOUNTER — Ambulatory Visit (INDEPENDENT_AMBULATORY_CARE_PROVIDER_SITE_OTHER): Payer: No Typology Code available for payment source | Admitting: Podiatry

## 2020-02-06 ENCOUNTER — Other Ambulatory Visit: Payer: Self-pay

## 2020-02-06 DIAGNOSIS — L603 Nail dystrophy: Secondary | ICD-10-CM

## 2020-02-06 MED ORDER — TERBINAFINE HCL 250 MG PO TABS: 250.0000 mg | ORAL_TABLET | Freq: Every day | ORAL | 0 refills | Status: DC

## 2020-02-06 NOTE — Patient Instructions (Signed)
Terbinafine tablets What is this medicine? TERBINAFINE (TER bin a feen) is an antifungal medicine. It is used to treat certain kinds of fungal or yeast infections. This medicine may be used for other purposes; ask your health care provider or pharmacist if you have questions. COMMON BRAND NAME(S): Lamisil, Terbinex What should I tell my health care provider before I take this medicine? They need to know if you have any of these conditions:  drink alcoholic beverages  kidney disease  liver disease  an unusual or allergic reaction to terbinafine, other medicines, foods, dyes, or preservatives  pregnant or trying to get pregnant  breast-feeding How should I use this medicine? Take this medicine by mouth with a full glass of water. Follow the directions on the prescription label. You can take this medicine with food or on an empty stomach. Take your medicine at regular intervals. Do not take your medicine more often than directed. Do not skip doses or stop your medicine early even if you feel better. Do not stop taking except on your doctor's advice. Talk to your pediatrician regarding the use of this medicine in children. Special care may be needed. Overdosage: If you think you have taken too much of this medicine contact a poison control center or emergency room at once. NOTE: This medicine is only for you. Do not share this medicine with others. What if I miss a dose? If you miss a dose, take it as soon as you can. If it is almost time for your next dose, take only that dose. Do not take double or extra doses. What may interact with this medicine? Do not take this medicine with any of the following medications:  thioridazine This medicine may also interact with the following medications:  beta-blockers  caffeine  cimetidine  cyclosporine  medicines for depression, anxiety, or psychotic disturbances  medicines for fungal infections like fluconazole and ketoconazole  medicines  for irregular heartbeat like amiodarone, flecainide and propafenone  rifampin  warfarin This list may not describe all possible interactions. Give your health care provider a list of all the medicines, herbs, non-prescription drugs, or dietary supplements you use. Also tell them if you smoke, drink alcohol, or use illegal drugs. Some items may interact with your medicine. What should I watch for while using this medicine? Visit your doctor or health care provider regularly. Tell your doctor right away if you have nausea or vomiting, loss of appetite, stomach pain on your right upper side, yellow skin, dark urine, light stools, or are over tired. Some fungal infections need many weeks or months of treatment to cure. If you are taking this medicine for a long time, you will need to have important blood work done. This medicine may cause serious skin reactions. They can happen weeks to months after starting the medicine. Contact your health care provider right away if you notice fevers or flu-like symptoms with a rash. The rash may be red or purple and then turn into blisters or peeling of the skin. Or, you might notice a red rash with swelling of the face, lips or lymph nodes in your neck or under your arms. What side effects may I notice from receiving this medicine? Side effects that you should report to your doctor or health care professional as soon as possible:  allergic reactions like skin rash or hives, swelling of the face, lips, or tongue  changes in vision  dark urine  fever or infection  general ill feeling or flu-like symptoms    light-colored stools  loss of appetite, nausea  rash, fever, and swollen lymph nodes  redness, blistering, peeling or loosening of the skin, including inside the mouth  right upper belly pain  unusually weak or tired  yellowing of the eyes or skin Side effects that usually do not require medical attention (report to your doctor or health care  professional if they continue or are bothersome):  changes in taste  diarrhea  hair loss  muscle or joint pain  stomach gas  stomach upset This list may not describe all possible side effects. Call your doctor for medical advice about side effects. You may report side effects to FDA at 1-800-FDA-1088. Where should I keep my medicine? Keep out of the reach of children. Store at room temperature below 25 degrees C (77 degrees F). Protect from light. Throw away any unused medicine after the expiration date. NOTE: This sheet is a summary. It may not cover all possible information. If you have questions about this medicine, talk to your doctor, pharmacist, or health care provider.  2020 Elsevier/Gold Standard (2018-06-29 15:37:07)  

## 2020-02-06 NOTE — Progress Notes (Signed)
She presents today for follow-up of her pathology report.  Objective: No change in physical exam.  Pathology report does demonstrate onychomycosis  Assessment: Onychomycosis  Plan: Discussed etiology pathology conservative surgical therapies this point time we discussed in great detail the pros and cons of oral therapy versus laser therapy and topical therapy.  She understands this and is amendable to would like to go ahead and try the oral therapy.  Started her on Lamisil 250 mg tablets 1 p.o. daily and I will follow-up with her in 1 month at which time we will request blood work and write another 90 pills.

## 2020-03-04 ENCOUNTER — Other Ambulatory Visit: Payer: Self-pay | Admitting: Podiatry

## 2020-03-05 NOTE — Telephone Encounter (Signed)
Please advise 

## 2020-03-10 ENCOUNTER — Other Ambulatory Visit: Payer: Self-pay

## 2020-03-10 ENCOUNTER — Ambulatory Visit (INDEPENDENT_AMBULATORY_CARE_PROVIDER_SITE_OTHER): Payer: No Typology Code available for payment source | Admitting: Podiatry

## 2020-03-10 ENCOUNTER — Encounter: Payer: Self-pay | Admitting: Podiatry

## 2020-03-10 DIAGNOSIS — Z79899 Other long term (current) drug therapy: Secondary | ICD-10-CM

## 2020-03-10 DIAGNOSIS — L603 Nail dystrophy: Secondary | ICD-10-CM

## 2020-03-10 MED ORDER — TERBINAFINE HCL 250 MG PO TABS: 250.0000 mg | ORAL_TABLET | Freq: Every day | ORAL | 0 refills | Status: DC

## 2020-03-10 NOTE — Progress Notes (Signed)
Patient said she just needed her bloodwork today that her refill had already been sent into the pharmacy. Gave patient her lab requisition and had her make a 4 month follow up. She was not seen by Dr. Milinda Pointer today.

## 2020-03-11 LAB — COMPREHENSIVE METABOLIC PANEL
AG Ratio: 1.8 (calc) (ref 1.0–2.5)
ALT: 24 U/L (ref 6–29)
AST: 42 U/L — ABNORMAL HIGH (ref 10–30)
Albumin: 4.2 g/dL (ref 3.6–5.1)
Alkaline phosphatase (APISO): 46 U/L (ref 31–125)
BUN: 11 mg/dL (ref 7–25)
CO2: 27 mmol/L (ref 20–32)
Calcium: 8.9 mg/dL (ref 8.6–10.2)
Chloride: 103 mmol/L (ref 98–110)
Creat: 0.71 mg/dL (ref 0.50–1.10)
Globulin: 2.4 g/dL (calc) (ref 1.9–3.7)
Glucose, Bld: 88 mg/dL (ref 65–139)
Potassium: 4 mmol/L (ref 3.5–5.3)
Sodium: 140 mmol/L (ref 135–146)
Total Bilirubin: 0.4 mg/dL (ref 0.2–1.2)
Total Protein: 6.6 g/dL (ref 6.1–8.1)

## 2020-06-04 ENCOUNTER — Other Ambulatory Visit: Payer: Self-pay

## 2020-06-04 ENCOUNTER — Ambulatory Visit: Payer: No Typology Code available for payment source | Admitting: Family Medicine

## 2020-06-04 ENCOUNTER — Encounter: Payer: Self-pay | Admitting: Family Medicine

## 2020-06-04 VITALS — BP 122/74 | HR 79 | Temp 98.3°F | Ht 64.0 in | Wt 137.6 lb

## 2020-06-04 DIAGNOSIS — H6122 Impacted cerumen, left ear: Secondary | ICD-10-CM | POA: Diagnosis not present

## 2020-06-04 DIAGNOSIS — R7401 Elevation of levels of liver transaminase levels: Secondary | ICD-10-CM

## 2020-06-04 LAB — HEPATIC FUNCTION PANEL
ALT: 12 U/L (ref 0–35)
AST: 15 U/L (ref 0–37)
Albumin: 4.1 g/dL (ref 3.5–5.2)
Alkaline Phosphatase: 39 U/L (ref 39–117)
Bilirubin, Direct: 0.1 mg/dL (ref 0.0–0.3)
Total Bilirubin: 0.5 mg/dL (ref 0.2–1.2)
Total Protein: 6.9 g/dL (ref 6.0–8.3)

## 2020-06-04 NOTE — Patient Instructions (Signed)
I like over the counter debrox to prevent build up. If still irritating you do flonase every other night.    Earwax Buildup, Adult The ears produce a substance called earwax that helps keep bacteria out of the ear and protects the skin in the ear canal. Occasionally, earwax can build up in the ear and cause discomfort or hearing loss. What are the causes? This condition is caused by a buildup of earwax. Ear canals are self-cleaning. Ear wax is made in the outer part of the ear canal and generally falls out in small amounts over time. When the self-cleaning mechanism is not working, earwax builds up and can cause decreased hearing and discomfort. Attempting to clean ears with cotton swabs can push the earwax deep into the ear canal and cause decreased hearing and pain. What increases the risk? This condition is more likely to develop in people who:  Clean their ears often with cotton swabs.  Pick at their ears.  Use earplugs or in-ear headphones often, or wear hearing aids. The following factors may also make you more likely to develop this condition:  Being female.  Being of older age.  Naturally producing more earwax.  Having narrow ear canals.  Having earwax that is overly thick or sticky.  Having excess hair in the ear canal.  Having eczema.  Being dehydrated. What are the signs or symptoms? Symptoms of this condition include:  Reduced or muffled hearing.  A feeling of fullness in the ear or feeling that the ear is plugged.  Fluid coming from the ear.  Ear pain or an itchy ear.  Ringing in the ear.  Coughing.  Balance problems.  An obvious piece of earwax that can be seen inside the ear canal. How is this diagnosed? This condition may be diagnosed based on:  Your symptoms.  Your medical history.  An ear exam. During the exam, your health care provider will look into your ear with an instrument called an otoscope. You may have tests, including a hearing  test. How is this treated? This condition may be treated by:  Using ear drops to soften the earwax.  Having the earwax removed by a health care provider. The health care provider may: ? Flush the ear with water. ? Use an instrument that has a loop on the end (curette). ? Use a suction device.  Having surgery to remove the wax buildup. This may be done in severe cases. Follow these instructions at home:  Take over-the-counter and prescription medicines only as told by your health care provider.  Do not put any objects, including cotton swabs, into your ear. You can clean the opening of your ear canal with a washcloth or facial tissue.  Follow instructions from your health care provider about cleaning your ears. Do not overclean your ears.  Drink enough fluid to keep your urine pale yellow. This will help to thin the earwax.  Keep all follow-up visits as told. If earwax builds up in your ears often or if you use hearing aids, consider seeing your health care provider for routine, preventive ear cleanings. Ask your health care provider how often you should schedule your cleanings.  If you have hearing aids, clean them according to instructions from the manufacturer and your health care provider.   Contact a health care provider if:  You have ear pain.  You develop a fever.  You have pus or other fluid coming from your ear.  You have hearing loss.  You have ringing  in your ears that does not go away.  You feel like the room is spinning (vertigo).  Your symptoms do not improve with treatment. Get help right away if:  You have bleeding from the affected ear.  You have severe ear pain. Summary  Earwax can build up in the ear and cause discomfort or hearing loss.  The most common symptoms of this condition include reduced or muffled hearing, a feeling of fullness in the ear, or feeling that the ear is plugged.  This condition may be diagnosed based on your symptoms, your  medical history, and an ear exam.  This condition may be treated by using ear drops to soften the earwax or by having the earwax removed by a health care provider.  Do not put any objects, including cotton swabs, into your ear. You can clean the opening of your ear canal with a washcloth or facial tissue. This information is not intended to replace advice given to you by your health care provider. Make sure you discuss any questions you have with your health care provider. Document Revised: 07/09/2019 Document Reviewed: 07/09/2019 Elsevier Patient Education  [place].

## 2020-06-04 NOTE — Progress Notes (Signed)
Patient: Cathy Austin MRN: [id number] DOB: [birth year] PCP: Orma Flaming, MD     Subjective:  Chief Complaint  Patient presents with  . Ear Pain  . Ear Fullness    She says she has to forcibly pop her ears at times, because of the pressure she is feeling.      HPI: The patient is a 39 y.o. female who presents today for ear fullness and ache. It is her left ear and she states it is constant pressure. It feels like she is in an airplane in her head. When she is at church all she can hear is her voice in her head. Its distracting and really bothering her. Sometimes she will have a sharp pain as well. She said she has a lot of popping as well. She can't do flonase b/c causes nose bleeds. Has been doing netty pot daily. She states she hears static, no tinnitus.   Wears earplugs at night. Denies any loud noises. No recent infections. She does have dizziness at times.    Just had recent treatment of toenail fungus with terbinafine. Needs repeat LFTs. Ast was only elevated to 42.   Review of Systems  Constitutional: Negative for chills, fatigue and fever.  HENT: Positive for ear pain and tinnitus. Negative for congestion, dental problem, ear discharge, hearing loss and trouble swallowing.   Eyes: Negative for visual disturbance.  Respiratory: Negative for cough, chest tightness and shortness of breath.   Cardiovascular: Negative for chest pain, palpitations and leg swelling.  Gastrointestinal: Negative for abdominal pain, blood in stool, diarrhea and nausea.  Endocrine: Negative for cold intolerance, polydipsia, polyphagia and polyuria.  Genitourinary: Negative for dysuria and hematuria.  Musculoskeletal: Negative for arthralgias.  Skin: Negative for rash.  Neurological: Negative for dizziness and headaches.  Psychiatric/Behavioral: Negative for dysphoric mood and sleep disturbance. The patient is not nervous/anxious.     Allergies Patient is allergic to  clindamycin/lincomycin, azithromycin, guaifenesin, other, and penicillins.  Past Medical History Patient  has a past medical history of Abnormal uterine bleeding, Allergy, Anxiety, Arthritis, Asthma, ASTHMA, EXERCISE INDUCED (04/09/2009), Family history of adverse reaction to anesthesia, GERD (04/09/2009), GERD (gastroesophageal reflux disease), and STD (sexually transmitted disease) (2014).  Surgical History Patient  has a past surgical history that includes Wisdom tooth extraction (Highschool); Colposcopy; Total laparoscopic hysterectomy with salpingectomy (N/A, 10/14/2019); and Cystoscopy (N/A, 10/14/2019).  Family History Pateint's family history includes Alcohol abuse in her father; Arthritis in her father and mother; Breast cancer in an other family member; Crohn's disease in her sister; Diabetes in her father; Hyperlipidemia in her father and mother; Hypertension in her father; Melanoma in her mother.  Social History Patient  reports that she has never smoked. She has never used smokeless tobacco. She reports current alcohol use of about 1.0 standard drink of alcohol per week. She reports that she does not use drugs.    Objective: Vitals:   06/04/20 1400  BP: 122/74  Pulse: 79  Temp: 98.3 F (36.8 C)  TempSrc: Temporal  Weight: 137 lb 9.6 oz (62.4 kg)  Height: 5\' 4"  (1.626 m)    Body mass index is 23.62 kg/m.  Physical Exam Vitals reviewed.  Constitutional:      Appearance: Normal appearance. She is normal weight.  HENT:     Head: Normocephalic and atraumatic.     Right Ear: Tympanic membrane, ear canal and external ear normal.     Left Ear: External ear normal. There is impacted cerumen.  Nose: Nose normal.     Mouth/Throat:     Mouth: Mucous membranes are moist.  Eyes:     Extraocular Movements: Extraocular movements intact.     Pupils: Pupils are equal, round, and reactive to light.  Cardiovascular:     Rate and Rhythm: Normal rate and regular rhythm.     Heart  sounds: Normal heart sounds.  Pulmonary:     Effort: Pulmonary effort is normal.     Breath sounds: Normal breath sounds.  Musculoskeletal:     Cervical back: Normal range of motion and neck supple.  Neurological:     General: No focal deficit present.     Mental Status: She is alert and oriented to person, place, and time.        Ceruminosis is noted.  Verbal consent obtained for irrigation and debridement. Wax is removed by syringing and manual debridement. Instructions for home care to prevent wax buildup are given. After successful removal, canal and TM visualized and wnl.   Assessment/plan: 1. Impacted cerumen of left ear Cerumen removed and she had instant relief. Tm/canal wnl. If still has symptoms recommended she try Flonase every other night for ETD. Let me know if no improvement. Would do debrox preventatively if continues to sleep with ear buds.   2. Elevated AST (SGOT)  - Hepatic function panel  This visit occurred during the SARS-CoV-2 public health emergency.  Safety protocols were in place, including screening questions prior to the visit, additional usage of staff PPE, and extensive cleaning of exam room while observing appropriate contact time as indicated for disinfecting solutions.    Return if symptoms worsen or fail to improve.   Orma Flaming, MD [place]   06/04/2020

## 2020-06-04 NOTE — Addendum Note (Signed)
Addended by: Orma Flaming on: 06/04/2020 03:00 PM   Modules accepted: Orders, Level of Service

## 2020-07-14 ENCOUNTER — Ambulatory Visit: Payer: No Typology Code available for payment source | Admitting: Podiatry

## 2020-07-22 NOTE — Progress Notes (Signed)
40 y.o. G0P0000 Married   Other or two or more races White or Caucasian Hispanic or Latino female here for annual exam.  S/P TLH/BS in 7/21 for a fibroid uterus  Patient states that she is starting to getting hot and has extra facial fair she has noticed some brown spots on her face and is having difficulty sleeping.   In the last couple of months, she feels emotionally like she is on her cycle all the time. She has more anxiety, trouble with temperature control (goes from hot to cold). Having palpitations intermittently which can affect her sleep. She has noticed an increase in hair growth on her nipples and above her lip. Just feels funny.  No increase in acne.   Intermittent discomfort with intercourse, feels like he is hitting her rectum. Libido comes and goes   She is having an increase in GI symptoms. Chronic constipation.     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 Colposcopy was done on 08/15/19, unsatisfactory. 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          Sexually active: Yes.    The current method of family planning is status post hysterectomy.    Exercising: Yes.    Cardio and body weight Smoker:  no  Health Maintenance: Pap:  07/24/19 Ascus Hr HPV + History of abnormal Pap:  Yes h/o CIN I and VAIN I in 2021 MMG:  11/09/15 TOMO Bi-rads 1 neg  BMD:   None  Colonoscopy: none  TDaP:  07/24/19 Gardasil: none    reports that she has never smoked. She has never used smokeless tobacco. She reports current alcohol use of about 1.0 standard drink of alcohol per week. She reports that she does not use drugs. She works as an Geologist, engineering.   Past Medical History:  Diagnosis Date  . Abnormal uterine bleeding   . Allergy   . Anxiety   . Arthritis    hands, feet, back  . Asthma   . ASTHMA, EXERCISE INDUCED 04/09/2009  . Family history of adverse reaction to anesthesia    N&V  . GERD  04/09/2009  . GERD (gastroesophageal reflux disease)   . 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 COLPOSCOPY    . CYSTOSCOPY N/A 10/14/2019   Procedure: CYSTOSCOPY;  Surgeon: [name], MD;  Location: [facility];  Service: Gynecology;  Laterality: N/A;  . TOTAL LAPAROSCOPIC HYSTERECTOMY WITH SALPINGECTOMY N/A 10/14/2019   Procedure: TOTAL LAPAROSCOPIC HYSTERECTOMY WITH BILATERAL SALPINGECTOMY;  Surgeon: [name], MD;  Location: [facility];  Service: Gynecology;  Laterality: N/A;  3 hours total  . WISDOM TOOTH EXTRACTION  Highschool    Current Outpatient Medications  Medication Sig Dispense Refill  . Biotin 10000 MCG TABS Take 10,000 mcg by mouth daily.    Marland Kitchen CANNABIDIOL PO Take 1 drop by mouth daily as needed (pain).    . cetirizine (ZYRTEC) 10 MG tablet Take 10 mg by mouth daily.    Marland Kitchen docusate sodium (COLACE) 100 MG capsule Take 1 capsule (100 mg total) by mouth 2 (two) times daily. 30 capsule 0  . fluticasone (FLONASE) 50 MCG/ACT nasal spray Place 1 spray into both nostrils daily.    . hydroxychloroquine (PLAQUENIL) 200 MG tablet     . Multiple Vitamin (MULTIVITAMIN WITH MINERALS) TABS tablet Take  1 tablet by mouth daily.    Marland Kitchen omeprazole (PRILOSEC) 40 MG capsule Take 40 mg by mouth daily.    . psyllium (METAMUCIL SMOOTH TEXTURE) 28 % packet Take 1 packet by mouth daily.     No current facility-administered medications for this visit.    Family History  Problem Relation Age of Onset  . Arthritis Mother   . Hyperlipidemia Mother   . Melanoma Mother   . Alcohol abuse Father        grandparents, aunt  . Hyperlipidemia Father        grandparents  . Hypertension Father   . Diabetes Father   . Arthritis Father   . Breast cancer Other        materna-grandmother, maternal aunt  . Crohn's disease Sister     Review of Systems  Genitourinary:       Pain with  sex Vaginal dryness  All other systems reviewed and are negative.   Exam:   BP 110/62   Pulse 84   Ht 5\' 4"  (1.626 m)   Wt 139 lb (63 kg)   LMP 10/03/2019   SpO2 98%   BMI 23.86 kg/m   Weight change: @WEIGHTCHANGE @ Height:   Height: 5\' 4"  (162.6 cm)  Ht Readings from Last 3 Encounters:  07/27/20 5\' 4"  (1.626 m)  06/04/20 5\' 4"  (1.626 m)  01/20/20 5\' 4"  (1.626 m)    General appearance: alert, cooperative and appears stated age Head: Normocephalic, without obvious abnormality, atraumatic Neck: no adenopathy, supple, symmetrical, trachea midline and thyroid normal to inspection and palpation Lungs: clear to auscultation bilaterally Cardiovascular: regular rate and rhythm Breasts: normal appearance, no masses or tenderness Abdomen: soft, non-tender; non distended,  no masses,  no organomegaly Extremities: extremities normal, atraumatic, no cyanosis or edema Skin: Skin color, texture, turgor normal. No rashes or lesions. No extra hair growth noted, but she removes it.  Lymph nodes: Cervical, supraclavicular, and axillary nodes normal. No abnormal inguinal nodes palpated Neurologic: Grossly normal   Pelvic: External genitalia:  no lesions              Urethra:  normal appearing urethra with no masses, tenderness or lesions              Bartholins and Skenes: normal                 Vagina: normal appearing vagina with normal color and discharge, no lesions. Well estrogenized.              Cervix: absent               Bimanual Exam:  Uterus:  uterus absent              Adnexa: no mass, fullness, tenderness               Rectovaginal: Confirms               Anus:  normal sphincter tone, no lesions  Gae Dry chaperoned for the exam.  1. Well woman exam Discussed breast self exam Discussed calcium and vit D intake   2. S/P laparoscopic hysterectomy   3. History of cervical dysplasia CIN I  4. History of vaginal dysplasia VAIN I  5. Temperature intolerance -  Follicle stimulating hormone - TSH  6. Sleep disturbance - Follicle stimulating hormone - TSH  7. Hirsutism - 17-Hydroxyprogesterone - Testosterone  8. Screening for vaginal cancer - Cytology - PAP

## 2020-07-27 ENCOUNTER — Ambulatory Visit (INDEPENDENT_AMBULATORY_CARE_PROVIDER_SITE_OTHER): Payer: No Typology Code available for payment source | Admitting: Obstetrics and Gynecology

## 2020-07-27 ENCOUNTER — Other Ambulatory Visit: Payer: Self-pay

## 2020-07-27 ENCOUNTER — Other Ambulatory Visit (HOSPITAL_COMMUNITY)
Admission: RE | Admit: 2020-07-27 | Discharge: 2020-07-27 | Disposition: A | Discharge status: Home / Self Care | Payer: No Typology Code available for payment source | Source: Ambulatory Visit | Attending: Obstetrics and Gynecology | Admitting: Obstetrics and Gynecology

## 2020-07-27 ENCOUNTER — Encounter: Payer: Self-pay | Admitting: Obstetrics and Gynecology

## 2020-07-27 VITALS — BP 110/62 | HR 84 | Ht 64.0 in | Wt 139.0 lb

## 2020-07-27 DIAGNOSIS — Z01419 Encounter for gynecological examination (general) (routine) without abnormal findings: Secondary | ICD-10-CM

## 2020-07-27 DIAGNOSIS — G479 Sleep disorder, unspecified: Secondary | ICD-10-CM

## 2020-07-27 DIAGNOSIS — K625 Hemorrhage of anus and rectum: Secondary | ICD-10-CM | POA: Insufficient documentation

## 2020-07-27 DIAGNOSIS — Z90710 Acquired absence of both cervix and uterus: Secondary | ICD-10-CM

## 2020-07-27 DIAGNOSIS — Z1272 Encounter for screening for malignant neoplasm of vagina: Secondary | ICD-10-CM | POA: Diagnosis present

## 2020-07-27 DIAGNOSIS — Z87410 Personal history of cervical dysplasia: Secondary | ICD-10-CM

## 2020-07-27 DIAGNOSIS — Z87411 Personal history of vaginal dysplasia: Secondary | ICD-10-CM | POA: Diagnosis not present

## 2020-07-27 DIAGNOSIS — R6889 Other general symptoms and signs: Secondary | ICD-10-CM

## 2020-07-27 DIAGNOSIS — L680 Hirsutism: Secondary | ICD-10-CM

## 2020-07-27 DIAGNOSIS — R1310 Dysphagia, unspecified: Secondary | ICD-10-CM | POA: Insufficient documentation

## 2020-07-27 DIAGNOSIS — D509 Iron deficiency anemia, unspecified: Secondary | ICD-10-CM | POA: Insufficient documentation

## 2020-07-27 NOTE — Patient Instructions (Signed)

## 2020-07-27 NOTE — Addendum Note (Signed)
Addended by: Dorothy Spark on: 07/27/2020 04:24 PM   Modules accepted: Orders

## 2020-07-29 ENCOUNTER — Other Ambulatory Visit: Payer: Self-pay | Admitting: Obstetrics and Gynecology

## 2020-07-29 DIAGNOSIS — R6889 Other general symptoms and signs: Secondary | ICD-10-CM

## 2020-07-29 LAB — CYTOLOGY - PAP
Comment: NEGATIVE
Diagnosis: NEGATIVE
High risk HPV: NEGATIVE

## 2020-07-29 LAB — TESTOSTERONE, TOTAL, LC/MS/MS: Testosterone, Total, LC-MS-MS: 28 ng/dL (ref 2–45)

## 2020-07-29 NOTE — Progress Notes (Signed)
See result note.  

## 2020-07-30 LAB — 17-HYDROXYPROGESTERONE: 17-OH-Progesterone, LC/MS/MS: 81 ng/dL

## 2020-07-30 LAB — TSH: TSH: 2.19 mIU/L

## 2020-07-30 LAB — FOLLICLE STIMULATING HORMONE: FSH: 19.7 m[IU]/mL

## 2020-10-24 ENCOUNTER — Encounter: Payer: Self-pay | Admitting: Obstetrics and Gynecology

## 2020-10-26 ENCOUNTER — Other Ambulatory Visit: Payer: Self-pay

## 2020-10-26 DIAGNOSIS — R6889 Other general symptoms and signs: Secondary | ICD-10-CM

## 2020-10-28 ENCOUNTER — Other Ambulatory Visit (INDEPENDENT_AMBULATORY_CARE_PROVIDER_SITE_OTHER): Payer: No Typology Code available for payment source

## 2020-10-28 ENCOUNTER — Other Ambulatory Visit: Payer: Self-pay

## 2020-10-28 DIAGNOSIS — R6889 Other general symptoms and signs: Secondary | ICD-10-CM

## 2020-10-29 LAB — FOLLICLE STIMULATING HORMONE: FSH: 2.7 m[IU]/mL

## 2020-10-29 LAB — TSH: TSH: 1.98 mIU/L

## 2021-07-27 NOTE — Progress Notes (Signed)
41 y.o. G0P0000 Married   ?Other or two or more races ?White or Caucasian Hispanic or Latino female here for annual exam.  H/O TLH/BS for a symptomatic fibroid uterus in 7/21. 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 ?Last pap on 07/24/19 was ASCUS, +HPV. Colposcopy was done on 08/15/19, unsatisfactory. Biopsies returned with CIN I, VAIN I, negative ECC. ? ?She has issues with constipation, has a BM ~qod. Some difficulty getting clean after a BM.  ? ?No bladder issue. ? ?Sexually active, no pain. ?  ? ?Patient's last menstrual period was 10/03/2019.          ?Sexually active: Yes.    ?The current method of family planning is hysterectomy ?Exercising: Yes.    Home exercise routine includes walking 1 hrs per day. Gym/ health club routine includes cardio. ?Smoker:  no ? ?Health Maintenance: ?Pap:  07/07/20 normal, neg hpv; 07/24/19 Ascus Hr HPV + ?History of abnormal Pap:  07/24/19 Ascus Hr HPV + ?Colposcopy was done on 08/15/19, unsatisfactory. 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 ? reports that she has never smoked. She has never used smokeless tobacco. She reports current alcohol use of about 1.0 standard drink per week. She reports that she does not use drugs. Has ~10 drinks a week. She works as an Geologist, engineering.  ? ?Past Medical History:  ?Diagnosis Date  ? Abnormal uterine bleeding   ? Allergy   ? Anxiety   ? Arthritis   ? hands, feet, back  ? Asthma   ? ASTHMA, EXERCISE INDUCED 04/09/2009  ? Family history of adverse reaction to anesthesia   ? N&V  ? GERD 04/09/2009  ? GERD (gastroesophageal reflux disease)   ? STD (sexually transmitted disease) 2014  ? HPV  ? ? ?Past Surgical History:  ?Procedure Laterality Date  ? COLPOSCOPY    ? CYSTOSCOPY N/A 10/14/2019  ? Procedure: CYSTOSCOPY;  Surgeon: [name], MD;  Location: [facility];   Service: Gynecology;  Laterality: N/A;  ? TOTAL LAPAROSCOPIC HYSTERECTOMY WITH SALPINGECTOMY N/A 10/14/2019  ? Procedure: TOTAL LAPAROSCOPIC HYSTERECTOMY WITH BILATERAL SALPINGECTOMY;  Surgeon: [name], MD;  Location: [facility];  Service: Gynecology;  Laterality: N/A;  3 hours total  ? WISDOM TOOTH EXTRACTION  Highschool  ? ? ?Current Outpatient Medications  ?Medication Sig Dispense Refill  ? Biotin 10000 MCG TABS Take 10,000 mcg by mouth daily.    ? CANNABIDIOL PO Take 1 drop by mouth daily as needed (pain).    ? cetirizine (ZYRTEC) 10 MG tablet Take 10 mg by mouth daily.    ? docusate sodium (COLACE) 100 MG capsule Take 1 capsule (100 mg total) by mouth 2 (two) times daily. 30 capsule 0  ? fluticasone (FLONASE) 50 MCG/ACT nasal spray Place 1 spray into both nostrils daily.    ? hydroxychloroquine (PLAQUENIL) 200 MG tablet     ? Multiple Vitamin (MULTIVITAMIN WITH MINERALS) TABS tablet Take 1 tablet by mouth daily.    ? omeprazole (PRILOSEC) 40 MG capsule Take 40 mg by mouth daily.    ? psyllium (METAMUCIL SMOOTH TEXTURE) 28 % packet Take 1 packet by mouth daily.    ? ?No current facility-administered medications for this visit.  ? ? ?Family History  ?Problem Relation Age of Onset  ? Arthritis  Mother   ? Hyperlipidemia Mother   ? Melanoma Mother   ? Alcohol abuse Father   ?     grandparents, aunt  ? Hyperlipidemia Father   ?     grandparents  ? Hypertension Father   ? Diabetes Father   ? Arthritis Father   ? Breast cancer Other   ?     materna-grandmother, maternal aunt  ? Crohn's disease Sister   ? ? ?Review of Systems ? ?Exam:   ?BP 128/80 (BP Location: Right Arm, Patient Position: Sitting, Cuff Size: Normal)   Ht '5\' 4"'$  (1.626 m)   Wt 137 lb (62.1 kg)   LMP 10/03/2019   BMI 23.52 kg/m?   Weight change: '@WEIGHTCHANGE'$ @ Height:   Height: '5\' 4"'$  (162.6 cm)  ?Ht Readings from Last 3 Encounters:  ?08/02/21 '5\' 4"'$  (1.626 m)  ?07/27/20 '5\' 4"'$  (1.626 m)  ?06/04/20 '5\' 4"'$  (1.626 m)   ? ? ?General appearance: alert, cooperative and appears stated age ?Head: Normocephalic, without obvious abnormality, atraumatic ?Neck: no adenopathy, supple, symmetrical, trachea midline and thyroid normal to inspection and palpation ?Lungs: clear to auscultation bilaterally ?Cardiovascular: regular rate and rhythm ?Breasts: normal appearance, no masses or tenderness ?Abdomen: soft, non-tender; non distended,  no masses,  no organomegaly ?Extremities: extremities normal, atraumatic, no cyanosis or edema ?Skin: Skin color, texture, turgor normal. No rashes or lesions ?Lymph nodes: Cervical, supraclavicular, and axillary nodes normal. ?No abnormal inguinal nodes palpated ?Neurologic: Grossly normal ? ? ?Pelvic: External genitalia:  no lesions ?             Urethra:  normal appearing urethra with no masses, tenderness or lesions ?             Bartholins and Skenes: normal    ?             Vagina: normal appearing vagina with a slight increase in thin, white vaginal discharge.  ?             Cervix: absent ?              ?Bimanual Exam:  Uterus:  uterus absent ?             Adnexa: no mass, fullness, tenderness ?              Rectovaginal: Confirms ?              Anus:  normal sphincter tone, no lesions ? ?Wandra Scot, RMA chaperoned for the exam. ? ?1. Well woman exam ?Discussed breast self exam ?Discussed calcium and vit D intake ? ?2. Screening for vaginal cancer ?- Cytology - PAP ? ?3. History of vaginal dysplasia ?- Cytology - PAP ? ?4. Laboratory exam ordered as part of routine general medical examination ?She is on plaquenil  ?- CBC ?- Comprehensive metabolic panel ? ?5. Elevated LDL cholesterol level ?- Lipid panel ? ?6. Vaginal discharge ?She reports frequent issues with D/C and h/o BV. No current symptoms ?- WET PREP FOR TRICH, YEAST, CLUE: + clue cells ?-No current symptoms of BV, she will call if she develops symptoms and desires treatment.  ? ? ? ? ?

## 2021-08-02 ENCOUNTER — Encounter: Payer: Self-pay | Admitting: Obstetrics and Gynecology

## 2021-08-02 ENCOUNTER — Other Ambulatory Visit (HOSPITAL_COMMUNITY)
Admission: RE | Admit: 2021-08-02 | Discharge: 2021-08-02 | Disposition: A | Discharge status: Home / Self Care | Payer: No Typology Code available for payment source | Source: Ambulatory Visit | Attending: Obstetrics and Gynecology | Admitting: Obstetrics and Gynecology

## 2021-08-02 ENCOUNTER — Ambulatory Visit (INDEPENDENT_AMBULATORY_CARE_PROVIDER_SITE_OTHER): Payer: No Typology Code available for payment source | Admitting: Obstetrics and Gynecology

## 2021-08-02 VITALS — BP 128/80 | Ht 64.0 in | Wt 137.0 lb

## 2021-08-02 DIAGNOSIS — E7800 Pure hypercholesterolemia, unspecified: Secondary | ICD-10-CM | POA: Diagnosis not present

## 2021-08-02 DIAGNOSIS — Z1272 Encounter for screening for malignant neoplasm of vagina: Secondary | ICD-10-CM

## 2021-08-02 DIAGNOSIS — Z87411 Personal history of vaginal dysplasia: Secondary | ICD-10-CM | POA: Diagnosis not present

## 2021-08-02 DIAGNOSIS — Z01419 Encounter for gynecological examination (general) (routine) without abnormal findings: Secondary | ICD-10-CM

## 2021-08-02 DIAGNOSIS — N898 Other specified noninflammatory disorders of vagina: Secondary | ICD-10-CM

## 2021-08-02 DIAGNOSIS — Z0000 Encounter for general adult medical examination without abnormal findings: Secondary | ICD-10-CM

## 2021-08-02 LAB — WET PREP FOR TRICH, YEAST, CLUE

## 2021-08-02 NOTE — Patient Instructions (Signed)

## 2021-08-03 LAB — LIPID PANEL
Cholesterol: 200 mg/dL — ABNORMAL HIGH (ref <200)
HDL: 83 mg/dL (ref >=50)
LDL Cholesterol (Calc): 99 mg/dL (calc)
Non-HDL Cholesterol (Calc): 117 mg/dL (calc) (ref <130)
Total CHOL/HDL Ratio: 2.4 (calc) (ref <5.0)
Triglycerides: 86 mg/dL (ref <150)

## 2021-08-03 LAB — CBC
HCT: 39.8 % (ref 35.0–45.0)
Hemoglobin: 13.5 g/dL (ref 11.7–15.5)
MCH: 32.7 pg (ref 27.0–33.0)
MCHC: 33.9 g/dL (ref 32.0–36.0)
MCV: 96.4 fL (ref 80.0–100.0)
MPV: 9.4 fL (ref 7.5–12.5)
Platelets: 274 10*3/uL (ref 140–400)
RBC: 4.13 10*6/uL (ref 3.80–5.10)
RDW: 11.8 % (ref 11.0–15.0)
WBC: 5.3 10*3/uL (ref 3.8–10.8)

## 2021-08-03 LAB — COMPREHENSIVE METABOLIC PANEL
AG Ratio: 1.8 (calc) (ref 1.0–2.5)
ALT: 17 U/L (ref 6–29)
AST: 18 U/L (ref 10–30)
Albumin: 4.2 g/dL (ref 3.6–5.1)
Alkaline phosphatase (APISO): 37 U/L (ref 31–125)
BUN: 10 mg/dL (ref 7–25)
CO2: 30 mmol/L (ref 20–32)
Calcium: 9.2 mg/dL (ref 8.6–10.2)
Chloride: 103 mmol/L (ref 98–110)
Creat: 0.83 mg/dL (ref 0.50–0.97)
Globulin: 2.3 g/dL (calc) (ref 1.9–3.7)
Glucose, Bld: 87 mg/dL (ref 65–99)
Potassium: 4 mmol/L (ref 3.5–5.3)
Sodium: 140 mmol/L (ref 135–146)
Total Bilirubin: 0.4 mg/dL (ref 0.2–1.2)
Total Protein: 6.5 g/dL (ref 6.1–8.1)

## 2021-08-04 LAB — CYTOLOGY - PAP
Adequacy: ABSENT
Comment: NEGATIVE
Diagnosis: NEGATIVE
High risk HPV: NEGATIVE

## 2021-10-11 ENCOUNTER — Other Ambulatory Visit: Payer: Self-pay | Admitting: Obstetrics and Gynecology

## 2021-10-11 DIAGNOSIS — Z1231 Encounter for screening mammogram for malignant neoplasm of breast: Secondary | ICD-10-CM

## 2021-10-21 IMAGING — US US PELVIS COMPLETE WITH TRANSVAGINAL
1 series · 13 of 25 positions shown · non-contrast
Comparison: None

CLINICAL DATA: Patient with irregular menstrual cycle.



[Series 1: us pelvis complete with transvaginal · 0.26mm/px · 13 of 58 slices shown]
[im 1/58]
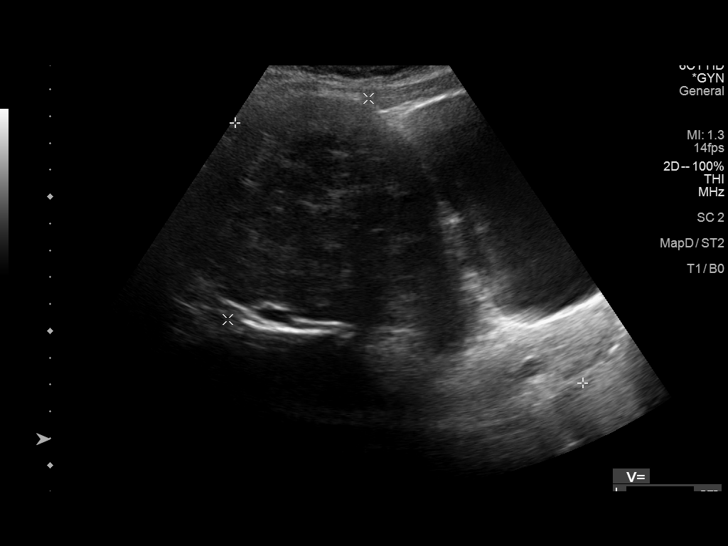
[im 5/58]
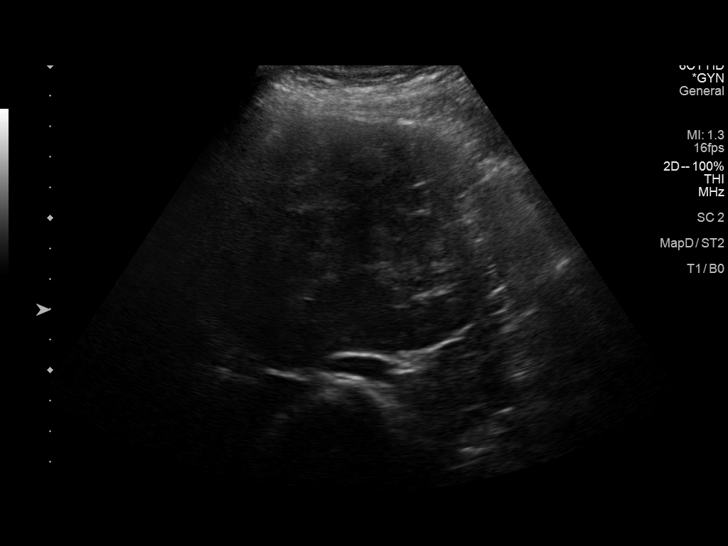
[im 10/58]
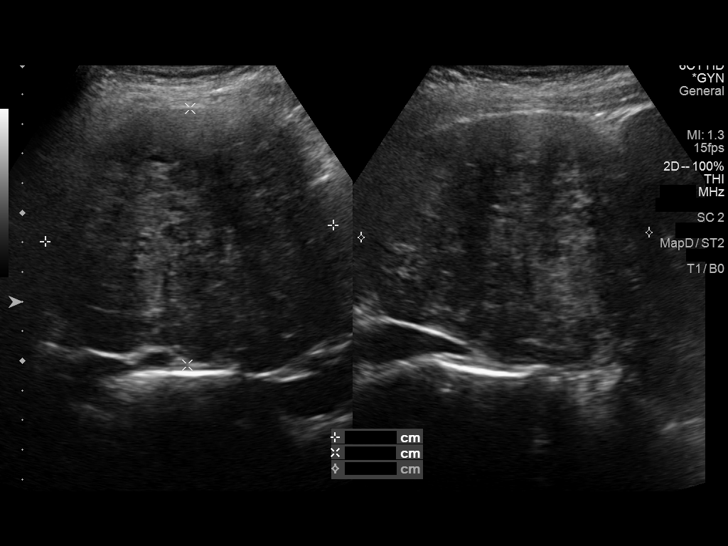
[im 15/58]
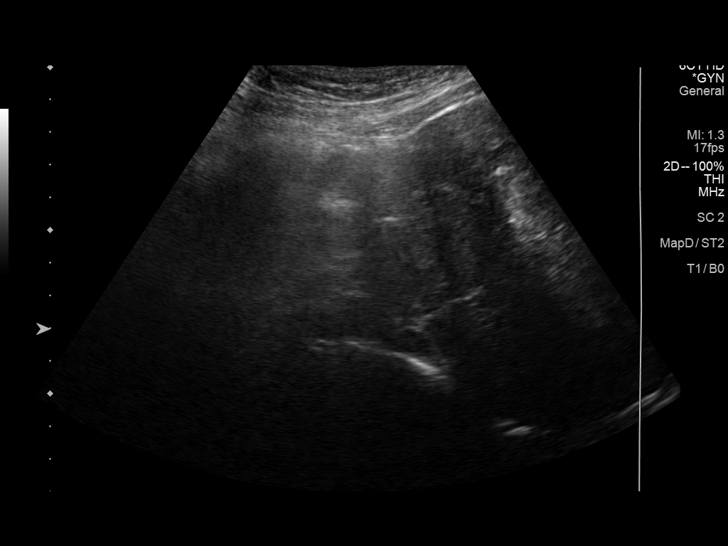
[im 20/58]
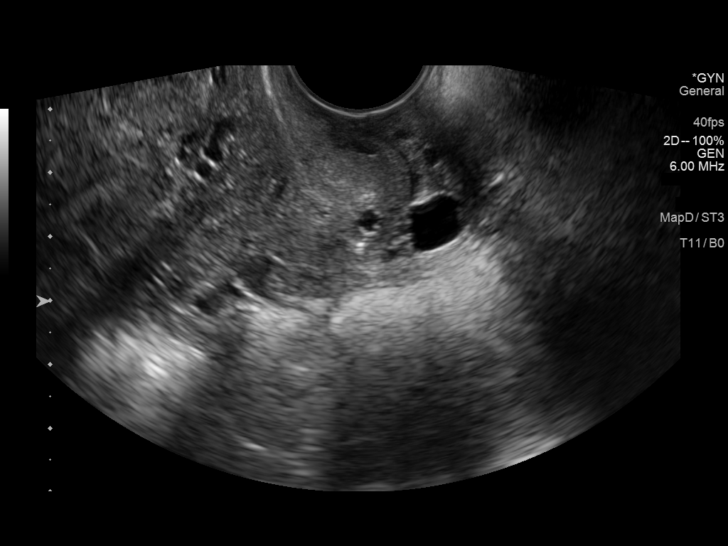
[im 24/58]
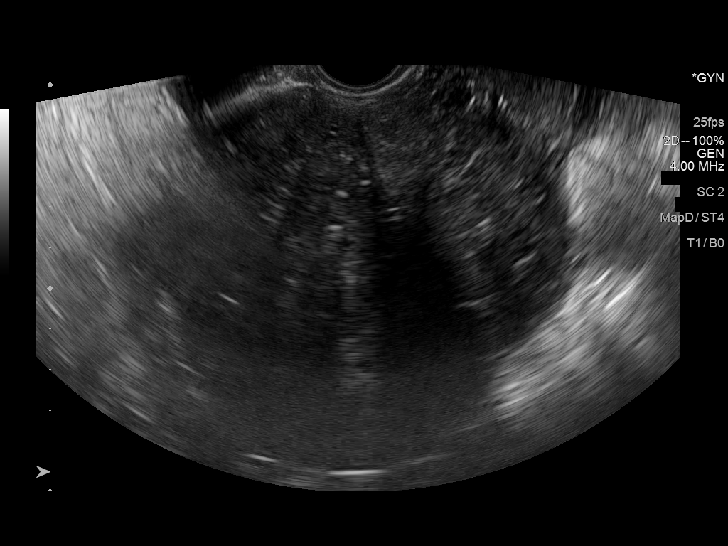
[im 29/58]
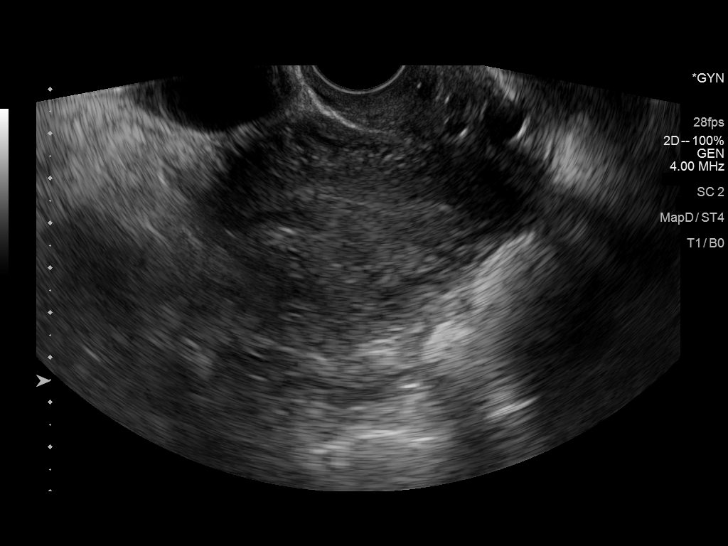
[im 34/58]
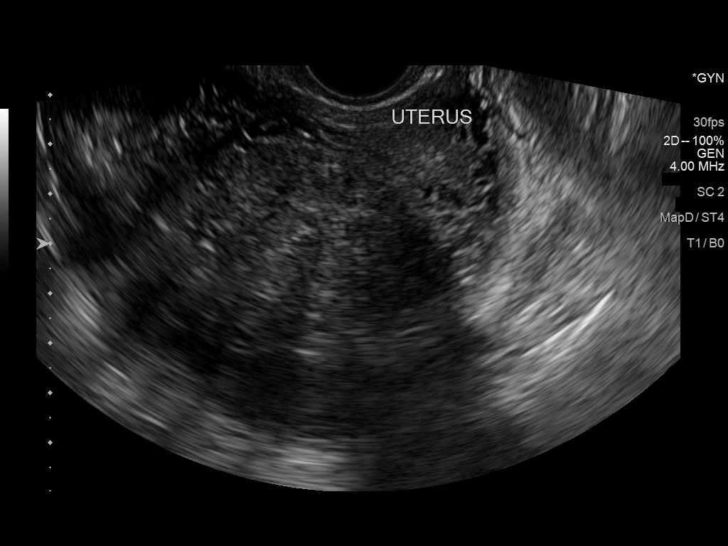
[im 39/58]
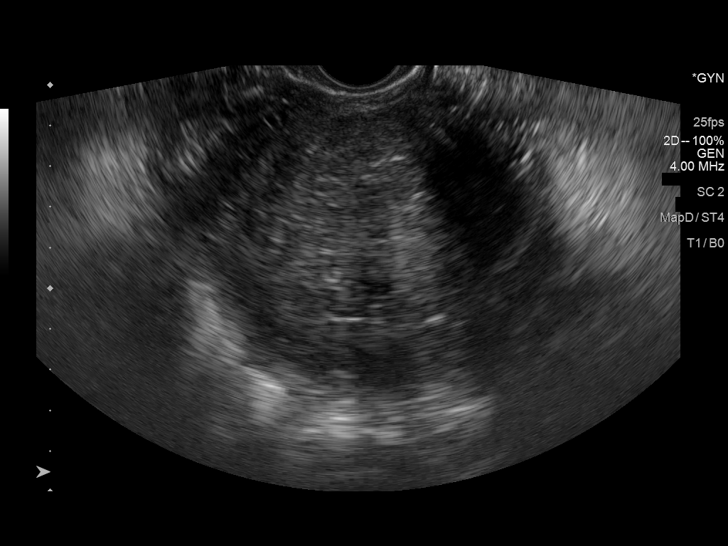
[im 43/58]
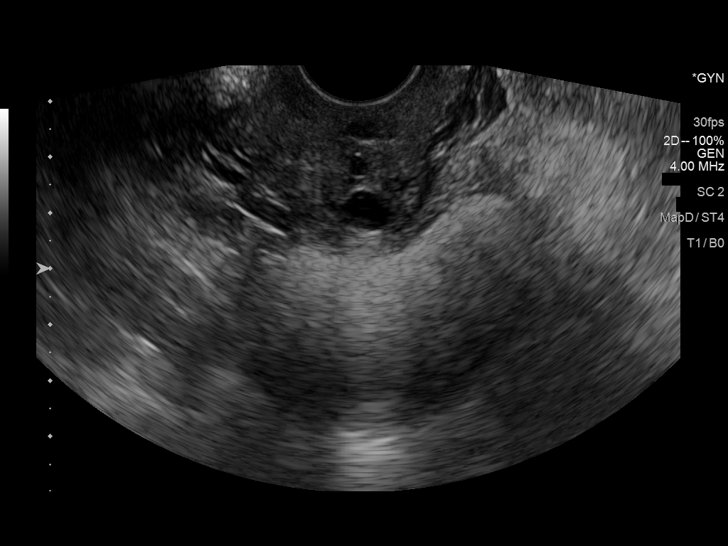
[im 48/58]
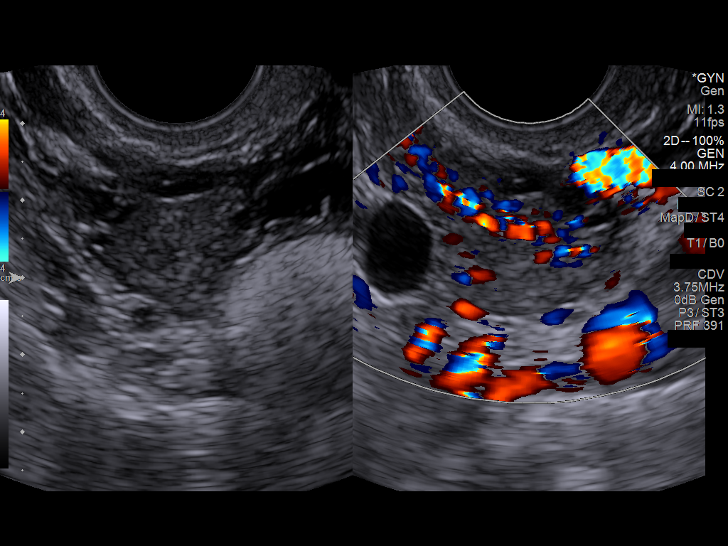
[im 53/58]
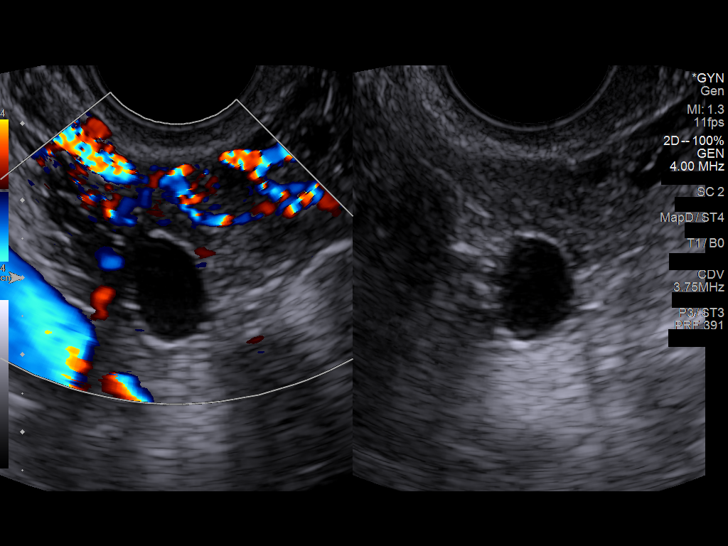
[im 58/58]
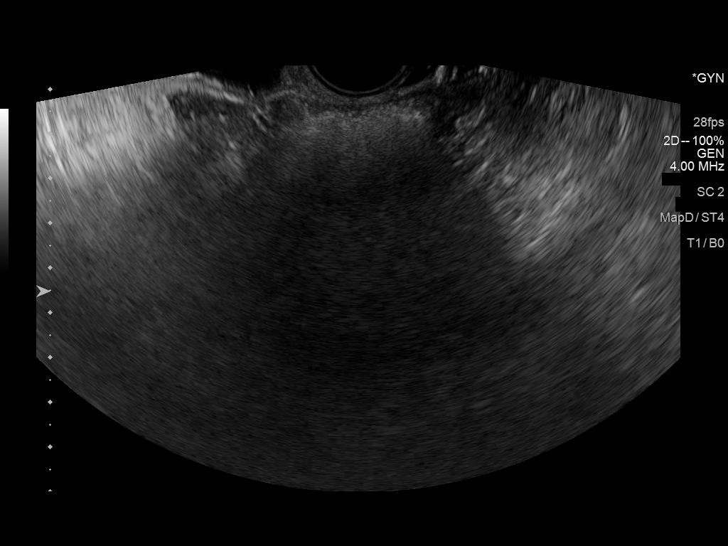

[13 of 25 positions shown; findings below may reference images not displayed]

FINDINGS: Uterus

Measurements: 16.2 x 9.8 x 10.9 cm = volume: 903 mL. There is a
x 2.7 x 9.7 cm large fibroid involving the superior slightly
rightward aspect of the uterine fundus.

Endometrium

Thickness: 3 mm.  No focal abnormality visualized.

Right ovary

Measurements: 2.2 x 1.7 x 2.6 cm = volume: 3.0 mL. There is a 1.1 x
1.7 x 1.0 cm paraovarian cyst.

Left ovary

Not visualized.

Other findings

No abnormal free fluid.
IMPRESSION: Endometrium measures 3 mm. If bleeding remains unresponsive to
hormonal or medical therapy, sonohysterogram should be considered
for focal lesion work-up. (Ref: Radiological Reasoning: Algorithmic
Workup of Abnormal Vaginal Bleeding with Endovaginal Sonography and
Sonohysterography. AJR [id number]; 191:S68-73)

## 2021-11-24 ENCOUNTER — Encounter: Payer: Self-pay | Admitting: Family Medicine

## 2021-11-24 ENCOUNTER — Ambulatory Visit (INDEPENDENT_AMBULATORY_CARE_PROVIDER_SITE_OTHER): Payer: Managed Care, Other (non HMO) | Admitting: Family Medicine

## 2021-11-24 VITALS — BP 116/68 | HR 86 | Temp 98.2°F | Ht 64.0 in | Wt 138.1 lb

## 2021-11-24 DIAGNOSIS — Z0000 Encounter for general adult medical examination without abnormal findings: Secondary | ICD-10-CM

## 2021-11-24 DIAGNOSIS — K219 Gastro-esophageal reflux disease without esophagitis: Secondary | ICD-10-CM | POA: Diagnosis not present

## 2021-11-24 MED ORDER — PANTOPRAZOLE SODIUM 40 MG PO TBEC: 40.0000 mg | DELAYED_RELEASE_TABLET | Freq: Every day | ORAL | 3 refills | Status: DC

## 2021-11-24 NOTE — Progress Notes (Signed)
Phone [phone]   Subjective:   Patient is a 41 y.o. female presenting for annual physical.    Chief Complaint  Patient presents with   Transfer of Care    Would like to come off of omeprazole    Toc.  Annual GERD-on omeprazole '40mg'$  daily  if not, vomits.  Dysphagia-saw Dr. Collene Mares 2017, polyp rectum.  Not want again.  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  See problem oriented charting- ROS- ROS: Gen: no fever, chills  Skin: no rash, itching ENT: no ear pain, ear drainage, nasal congestion, rhinorrhea, sinus pressure, sore throat Eyes: no blurry vision, double vision Resp: no cough, wheeze,SOB CV: no CP, palpitations, LE edema,  GI: hpi GU: no dysuria, urgency, frequency, hematuria MSK: no joint pain, myalgias, back pain Neuro: no dizziness, headache, weakness, vertigo Psych: no depression, anxiety, insomnia, SI  Gets poison ivy freq  The following were reviewed and entered/updated in epic: Past Medical History:  Diagnosis Date   Abnormal uterine bleeding    Allergy    Anxiety    Arthritis    hands, feet, back   Asthma    ASTHMA, EXERCISE INDUCED 04/09/2009   Family history of adverse reaction to anesthesia    N&V   GERD 04/09/2009   GERD (gastroesophageal reflux disease)    STD (sexually transmitted disease) 2014   HPV   Patient Active Problem List   Diagnosis Date Noted   Dysphagia 07/27/2020   Iron deficiency anemia 07/27/2020   Rectal bleeding 07/27/2020   GAD (generalized anxiety disorder) 44/81/8563   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   S/P laparoscopic hysterectomy 10/14/2019   Abnormal Pap smear of cervix 08/21/2019   IBS (irritable bowel syndrome) 07/24/2019   ASTHMA, EXERCISE INDUCED 04/09/2009   GERD 04/09/2009   Past Surgical History:  Procedure Laterality Date   COLPOSCOPY     CYSTOSCOPY N/A 10/14/2019   Procedure: CYSTOSCOPY;  Surgeon: [name], MD;  Location: [facility];  Service: Gynecology;   Laterality: N/A;   TOTAL LAPAROSCOPIC HYSTERECTOMY WITH SALPINGECTOMY N/A 10/14/2019   Procedure: TOTAL LAPAROSCOPIC HYSTERECTOMY WITH BILATERAL SALPINGECTOMY;  Surgeon: [name], MD;  Location: [facility];  Service: Gynecology;  Laterality: N/A;  3 hours total   WISDOM TOOTH EXTRACTION  Highschool    Family History  Problem Relation Age of Onset   Cancer Mother        skin   Arthritis Mother    Hyperlipidemia Mother    Melanoma Mother    Cancer Father        skin   Alcohol abuse Father        grandparents, aunt   Hyperlipidemia Father        grandparents   Hypertension Father    Diabetes Father    Arthritis Father    Crohn's disease Sister    Cancer Maternal Grandmother 36       melanoma   Cirrhosis Maternal Grandmother        nash   Atrial fibrillation Maternal Grandfather    Cancer Paternal Grandmother 76       breast   Macular degeneration Paternal Grandmother    Breast cancer Other        materna-grandmother, maternal aunt    Medications- reviewed and updated Current Outpatient Medications  Medication Sig Dispense Refill   cetirizine (ZYRTEC) 10 MG tablet Take 10 mg by mouth daily.     docusate sodium (COLACE) 100 MG capsule Take 1 capsule (100  mg total) by mouth 2 (two) times daily. 30 capsule 0   fluticasone (FLONASE) 50 MCG/ACT nasal spray Place 1 spray into both nostrils daily.     hydroxychloroquine (PLAQUENIL) 200 MG tablet      hydrOXYzine (ATARAX) 25 MG tablet Take 1 tablet twice a day by oral route as needed.     Melatonin 1 MG CHEW      omeprazole (PRILOSEC) 40 MG capsule Take 40 mg by mouth daily.     psyllium (METAMUCIL SMOOTH TEXTURE) 28 % packet Take 1 packet by mouth daily.     No current facility-administered medications for this visit.    Allergies-reviewed and updated Allergies  Allergen Reactions   Clindamycin/Lincomycin Cough   Penicillins Rash    throat spasm Other reaction(s): Unknown, vomiting   Other  Itching    Walnut    Azithromycin Diarrhea    chest tigntness   Guaifenesin     REACTION: throat spasms    Social History   Social History Narrative   1 step   X-ray tech   Objective  Objective:  BP 116/68   Pulse 86   Temp 98.2 F (36.8 C) (Temporal)   Ht '5\' 4"'$  (1.626 m)   Wt 138 lb 2 oz (62.7 kg)   LMP 10/03/2019   SpO2 98%   BMI 23.71 kg/m  Physical Exam  Gen: WDWN NAD HEENT: NCAT, conjunctiva not injected, sclera nonicteric TM WNL B, OP moist, no exudates  NECK:  supple, no thyromegaly, no nodes, no carotid bruits CARDIAC: RRR, S1S2+, no murmur. DP 2+B LUNGS: CTAB. No wheezes ABDOMEN:  BS+, soft, NTND, No HSM, no masses EXT:  no edema MSK: no gross abnormalities. MS 5/5 all 4 NEURO: A&O x3.  CN II-XII intact.  PSYCH: normal mood. Good eye contact    Assessment and Plan   Health Maintenance counseling: 1. Anticipatory guidance: Patient counseled regarding regular dental exams q6 months, eye exams,  avoiding smoking and second hand smoke, limiting alcohol to 1 beverage per day, no illicit drugs.   2. Risk factor reduction:  Advised patient of need for regular exercise and diet rich and fruits and vegetables to reduce risk of heart attack and stroke. Exercise- reg.  Wt Readings from Last 3 Encounters:  11/24/21 138 lb 2 oz (62.7 kg)  08/02/21 137 lb (62.1 kg)  07/27/20 139 lb (63 kg)   3. Immunizations/screenings/ancillary studies Immunization History  Administered Date(s) Administered   Influenza Split 02/03/2012   Influenza Whole 01/03/2019   PFIZER(Purple Top)SARS-COV-2 Vaccination 01/16/2020   Tdap 07/24/2019   Health Maintenance Due  Topic Date Due   Hepatitis C Screening  Never done    4. Cervical cancer screening- 07/2021 5. Breast cancer screening-  mammogram sept 6. Colon cancer screening - n/a 7. Skin cancer screening- advised regular sunscreen use. Denies worrisome, changing, or new skin lesions.    Problem List Items Addressed This Visit        Digestive   GERD   Other Visit Diagnoses     Wellness examination    -  Primary     Wellness-anticipatory guidance.  Work on [employer].   F/u 1 yr  GERD-chronic.  Stable on omep but ins not cover so change to protonix 40  Recommended follow up: annual No follow-ups on file. Future Appointments  Date Time Provider [place]  12/08/2021  3:50 PM [facility] MM 3 [facility]  08/04/2022  2:30 PM [name], MD [facility] None  Lab/Order associations:non fasting   ICD-10-CM   1. Wellness examination  Z00.00     2. Gastroesophageal reflux disease without esophagitis  K21.9       No orders of the defined types were placed in this encounter.   Wellington Hampshire, MD

## 2021-11-24 NOTE — Patient Instructions (Signed)
Welcome to [facility] at [place]! It was a pleasure meeting you today. ° °As discussed, Please schedule a 12 month follow up visit today. ° °PLEASE NOTE: ° °If you had any LAB tests please let us know if you have not heard back within a few days. You may see your results on MyChart before we have a chance to review them but we will give you a call once they are reviewed by us. If we ordered any REFERRALS today, please let us know if you have not heard from their office within the next week.  °Let us know through MyChart if you are needing REFILLS, or have your pharmacy send us the request. You can also use MyChart to communicate with me or any office staff. ° °Please try these tips to maintain a healthy lifestyle: ° °Eat most of your calories during the day when you are active. Eliminate processed foods including packaged sweets (pies, cakes, cookies), reduce intake of potatoes, white bread, white pasta, and white rice. Look for whole grain options, oat flour or almond flour. ° °Each meal should contain half fruits/vegetables, one quarter protein, and one quarter carbs (no bigger than a computer mouse). ° °Cut down on sweet beverages. This includes juice, soda, and sweet tea. Also watch fruit intake, though this is a healthier sweet option, it still contains natural sugar! Limit to 3 servings daily. ° °Drink at least 1 glass of water with each meal and aim for at least 8 glasses per day ° °Exercise at least 150 minutes every week.   °

## 2021-12-08 ENCOUNTER — Ambulatory Visit
Admission: RE | Admit: 2021-12-08 | Discharge: 2021-12-08 | Disposition: A | Discharge status: Home / Self Care | Payer: Managed Care, Other (non HMO) | Source: Ambulatory Visit | Attending: Obstetrics and Gynecology | Admitting: Obstetrics and Gynecology

## 2021-12-08 DIAGNOSIS — Z1231 Encounter for screening mammogram for malignant neoplasm of breast: Secondary | ICD-10-CM

## 2021-12-27 ENCOUNTER — Encounter: Payer: Self-pay | Admitting: Obstetrics and Gynecology

## 2022-03-04 ENCOUNTER — Encounter: Payer: Self-pay | Admitting: Family Medicine

## 2022-03-04 ENCOUNTER — Ambulatory Visit (INDEPENDENT_AMBULATORY_CARE_PROVIDER_SITE_OTHER): Payer: Managed Care, Other (non HMO) | Admitting: Family Medicine

## 2022-03-04 VITALS — BP 100/60 | HR 75 | Temp 98.0°F | Wt 135.1 lb

## 2022-03-04 DIAGNOSIS — D500 Iron deficiency anemia secondary to blood loss (chronic): Secondary | ICD-10-CM | POA: Diagnosis not present

## 2022-03-04 DIAGNOSIS — R5383 Other fatigue: Secondary | ICD-10-CM | POA: Diagnosis not present

## 2022-03-04 DIAGNOSIS — M79641 Pain in right hand: Secondary | ICD-10-CM

## 2022-03-04 NOTE — Progress Notes (Signed)
Subjective:     Patient ID: Cathy Austin, female    DOB: [birth year], 41 y.o.   MRN: [id number]  Chief Complaint  Patient presents with   Hand Issues    Having stiffness in right hand and has boney places, evaluation for RA, started a few months ago     HPI Past few months, stiffness R hand joints.  Ache and trhob.  More DIP.  Some fatigue as well in cycles. Can barely do push ups at times.  Moods good, exercises, eats right.  Stiff if sits too long Goes in cycle-few wks good and then few wks bad. No energy.  No f/c   rings not fitting on R again.  Hands can get red and hot at times.  Stopped taking Plaquinel in August. But things were before stopped it. Was on for lichen Not feeling great past 2 wks.   SSRI,wellbutrin in past-bad SE  Dad psoriatic, mom OA  Health Maintenance Due  Topic Date Due   Hepatitis C Screening  Never done    Past Medical History:  Diagnosis Date   Abnormal uterine bleeding    Allergy    Anxiety    Arthritis    hands, feet, back   Asthma    ASTHMA, EXERCISE INDUCED 04/09/2009   Family history of adverse reaction to anesthesia    N&V   GERD 04/09/2009   GERD (gastroesophageal reflux disease)    STD (sexually transmitted disease) 2014   HPV    Past Surgical History:  Procedure Laterality Date   COLPOSCOPY     CYSTOSCOPY N/A 10/14/2019   Procedure: CYSTOSCOPY;  Surgeon: [name], MD;  Location: [facility];  Service: Gynecology;  Laterality: N/A;   TOTAL LAPAROSCOPIC HYSTERECTOMY WITH SALPINGECTOMY N/A 10/14/2019   Procedure: TOTAL LAPAROSCOPIC HYSTERECTOMY WITH BILATERAL SALPINGECTOMY;  Surgeon: [name], MD;  Location: [facility];  Service: Gynecology;  Laterality: N/A;  3 hours total   WISDOM TOOTH EXTRACTION  Highschool    Outpatient Medications Prior to Visit  Medication Sig Dispense Refill   cetirizine (ZYRTEC) 10 MG tablet Take 10 mg by mouth daily.     docusate sodium  (COLACE) 100 MG capsule Take 1 capsule (100 mg total) by mouth 2 (two) times daily. 30 capsule 0   fluticasone (FLONASE) 50 MCG/ACT nasal spray Place 1 spray into both nostrils daily.     hydrOXYzine (ATARAX) 25 MG tablet Take 1 tablet twice a day by oral route as needed.     Melatonin 1 MG CHEW      pantoprazole (PROTONIX) 40 MG tablet Take 1 tablet (40 mg total) by mouth daily. 90 tablet 3   psyllium (METAMUCIL SMOOTH TEXTURE) 28 % packet Take 1 packet by mouth daily.     hydroxychloroquine (PLAQUENIL) 200 MG tablet      No facility-administered medications prior to visit.    Allergies  Allergen Reactions   Clindamycin/Lincomycin Cough   Penicillins Rash    throat spasm Other reaction(s): Unknown, vomiting   Other Itching    Walnut   Azithromycin Diarrhea    chest tigntness   Guaifenesin     REACTION: throat spasms   ROS neg/noncontributory except as noted HPI/below      Objective:     BP 100/60   Pulse 75   Temp 98 F (36.7 C) (Temporal)   Wt 135 lb 2 oz (61.3 kg)   LMP 10/03/2019   SpO2 98%   BMI  23.19 kg/m  Wt Readings from Last 3 Encounters:  03/04/22 135 lb 2 oz (61.3 kg)  11/24/21 138 lb 2 oz (62.7 kg)  08/02/21 137 lb (62.1 kg)    Physical Exam   Gen: WDWN NAD HEENT: NCAT, conjunctiva not injected, sclera nonicteric NECK:  supple, no thyromegaly, no nodes, no carotid bruits CARDIAC: RRR, S1S2+, no murmur. DP 2+B LUNGS: CTAB. No wheezes ABDOMEN:  BS+, soft, NTND, No HSM, no masses EXT:  no edema MSK: no gross abnormalities.  Ms 5/5 all 4.  +small heberden's.  No TTP hands. No swollen/tender joints.  R 5th toe PIP-boney prominence laterally NEURO: A&O x3.  CN II-XII intact.  PSYCH: normal mood. Good eye contact     Assessment & Plan:   Problem List Items Addressed This Visit       Other   Iron deficiency anemia   Relevant Orders   CBC with Differential/Platelet   Vitamin B12   Other Visit Diagnoses     Other fatigue    -  Primary    Relevant Orders   Comprehensive metabolic panel   TSH   CBC with Differential/Platelet   C-reactive protein   Sedimentation rate   Vitamin B12   VITAMIN D 25 Hydroxy (Vit-D Deficiency, Fractures)   Rheumatoid factor   FSH   Pain of right hand       Relevant Orders   C-reactive protein   Sedimentation rate   Rheumatoid factor      H/o IDA-check cbc, B12 Fatigue-check cmp,tsh,cbc,esr,B12,D, fsh.  ? If perimenopause, CFS-but cyclic, autoimmune, other.  Does have Raynaud's as well  Pain R hand-check esr,crp,rf No orders of the defined types were placed in this encounter.   Wellington Hampshire, MD

## 2022-03-04 NOTE — Patient Instructions (Addendum)
It was very nice to see you today!  Happy Holidays!  tumeric   PLEASE NOTE:  If you had any lab tests please let us know if you have not heard back within a few days. You may see your results on MyChart before we have a chance to review them but we will give you a call once they are reviewed by [place]. If we ordered any referrals today, please let us know if you have not heard from their office within the next week.   Please try these tips to maintain a healthy lifestyle:  Eat most of your calories during the day when you are active. Eliminate processed foods including packaged sweets (pies, cakes, cookies), reduce intake of potatoes, white bread, white pasta, and white rice. Look for whole grain options, oat flour or almond flour.  Each meal should contain half fruits/vegetables, one quarter protein, and one quarter carbs (no bigger than a computer mouse).  Cut down on sweet beverages. This includes juice, soda, and sweet tea. Also watch fruit intake, though this is a healthier sweet option, it still contains natural sugar! Limit to 3 servings daily.  Drink at least 1 glass of water with each meal and aim for at least 8 glasses per day  Exercise at least 150 minutes every week.

## 2022-03-05 LAB — CBC WITH DIFFERENTIAL/PLATELET
Absolute Monocytes: 489 cells/uL (ref 200–950)
Basophils Absolute: 60 cells/uL (ref 0–200)
Basophils Relative: 0.9 %
Eosinophils Absolute: 101 cells/uL (ref 15–500)
Eosinophils Relative: 1.5 %
HCT: 38.6 % (ref 35.0–45.0)
Hemoglobin: 13.1 g/dL (ref 11.7–15.5)
Lymphs Abs: 2935 cells/uL (ref 850–3900)
MCH: 32.2 pg (ref 27.0–33.0)
MCHC: 33.9 g/dL (ref 32.0–36.0)
MCV: 94.8 fL (ref 80.0–100.0)
MPV: 9.3 fL (ref 7.5–12.5)
Monocytes Relative: 7.3 %
Neutro Abs: 3116 cells/uL (ref 1500–7800)
Neutrophils Relative %: 46.5 %
Platelets: 292 10*3/uL (ref 140–400)
RBC: 4.07 10*6/uL (ref 3.80–5.10)
RDW: 11.5 % (ref 11.0–15.0)
Total Lymphocyte: 43.8 %
WBC: 6.7 10*3/uL (ref 3.8–10.8)

## 2022-03-05 LAB — COMPREHENSIVE METABOLIC PANEL
AG Ratio: 1.9 (calc) (ref 1.0–2.5)
ALT: 19 U/L (ref 6–29)
AST: 17 U/L (ref 10–30)
Albumin: 4.4 g/dL (ref 3.6–5.1)
Alkaline phosphatase (APISO): 39 U/L (ref 31–125)
BUN: 13 mg/dL (ref 7–25)
CO2: 26 mmol/L (ref 20–32)
Calcium: 9 mg/dL (ref 8.6–10.2)
Chloride: 103 mmol/L (ref 98–110)
Creat: 0.96 mg/dL (ref 0.50–0.99)
Globulin: 2.3 g/dL (calc) (ref 1.9–3.7)
Glucose, Bld: 88 mg/dL (ref 65–99)
Potassium: 4 mmol/L (ref 3.5–5.3)
Sodium: 139 mmol/L (ref 135–146)
Total Bilirubin: 0.3 mg/dL (ref 0.2–1.2)
Total Protein: 6.7 g/dL (ref 6.1–8.1)

## 2022-03-05 LAB — FOLLICLE STIMULATING HORMONE: FSH: 20.7 m[IU]/mL

## 2022-03-05 LAB — SEDIMENTATION RATE: Sed Rate: 2 mm/h (ref 0–20)

## 2022-03-05 LAB — RHEUMATOID FACTOR: Rheumatoid fact SerPl-aCnc: <14 [IU]/mL

## 2022-03-05 LAB — VITAMIN D 25 HYDROXY (VIT D DEFICIENCY, FRACTURES): Vit D, 25-Hydroxy: 36 ng/mL (ref 30–100)

## 2022-03-05 LAB — TSH: TSH: 2.71 m[IU]/L

## 2022-03-05 LAB — VITAMIN B12: Vitamin B-12: 578 pg/mL (ref 200–1100)

## 2022-03-05 LAB — C-REACTIVE PROTEIN: CRP: 0.4 mg/L (ref <8.0)

## 2022-03-06 NOTE — Progress Notes (Signed)
Labs normal.  Ladue not quite in menopause

## 2022-04-15 ENCOUNTER — Encounter: Payer: Self-pay | Admitting: Family

## 2022-04-15 ENCOUNTER — Ambulatory Visit (INDEPENDENT_AMBULATORY_CARE_PROVIDER_SITE_OTHER): Payer: Managed Care, Other (non HMO) | Admitting: Family

## 2022-04-15 VITALS — BP 119/74 | HR 98 | Temp 98.4°F | Ht 64.0 in | Wt 131.5 lb

## 2022-04-15 DIAGNOSIS — M546 Pain in thoracic spine: Secondary | ICD-10-CM

## 2022-04-15 DIAGNOSIS — R1013 Epigastric pain: Secondary | ICD-10-CM | POA: Diagnosis not present

## 2022-04-15 LAB — POCT URINALYSIS DIPSTICK
Bilirubin, UA: NEGATIVE
Glucose, UA: NEGATIVE
Leukocytes, UA: NEGATIVE
Nitrite, UA: NEGATIVE
Protein, UA: NEGATIVE
Spec Grav, UA: 1.01 (ref 1.010–1.025)
Urobilinogen, UA: 0.2 E.U./dL
pH, UA: 8 — AB (ref 5.0–8.0)

## 2022-04-15 NOTE — Progress Notes (Signed)
Patient ID: Yamina Lenis, female    DOB: 11-Jan-1981, 42 y.o.   MRN: [id number]  Chief Complaint  Patient presents with   Back Pain    Pt c/o back pain/chest discomfort at sternum area. Pt states started Wednesday back pain and Thursday night the sternum discomfort started. Pain is tight.     HPI:      Abd - back pain:  reports pain in epigastric region that bores to her back, feels very tight like wearing a tight corsett. Taking Protonix '40mg'$  qd, also took extra OTC antacids without relief. Denies any different foods, started 2 days ago and seems to be getting worse. Denies hx of kidney stones or ulcers, reports having gallstone in past, but this does not feel the same.       Assessment & Plan:  1. Epigastric pain -  ordering stat US, pt afraid to go to ER d/t cost. Advised to take 2 Protonix or 1 pill bid for next 3-4 days only. Offered Toradol shot, pt states pain meds never work for her.  - US Abdomen Complete; Future  2. Acute midline thoracic back pain - UA neg for infection, positive for blood. d/t sx possible renal stone, ordering Abd [place] stat. Pt advised to go to ER if any worsening pain.  - POCT Urinalysis Dipstick - US Abdomen Complete; Future   Subjective:    Outpatient Medications Prior to Visit  Medication Sig Dispense Refill   cetirizine (ZYRTEC) 10 MG tablet Take 10 mg by mouth daily.     docusate sodium (COLACE) 100 MG capsule Take 1 capsule (100 mg total) by mouth 2 (two) times daily. 30 capsule 0   fluticasone (FLONASE) 50 MCG/ACT nasal spray Place 1 spray into both nostrils daily.     hydrOXYzine (ATARAX) 25 MG tablet Take 1 tablet twice a day by oral route as needed.     Melatonin 1 MG CHEW      pantoprazole (PROTONIX) 40 MG tablet Take 1 tablet (40 mg total) by mouth daily. 90 tablet 3   psyllium (METAMUCIL SMOOTH TEXTURE) 28 % packet Take 1 packet by mouth daily.     No facility-administered medications prior to visit.   Past Medical History:   Diagnosis Date   Abnormal uterine bleeding    Allergy    Anxiety    Arthritis    hands, feet, back   Asthma    ASTHMA, EXERCISE INDUCED 04/09/2009   Family history of adverse reaction to anesthesia    N&V   GERD 04/09/2009   GERD (gastroesophageal reflux disease)    STD (sexually transmitted disease) 2014   HPV   Past Surgical History:  Procedure Laterality Date   COLPOSCOPY     CYSTOSCOPY N/A 10/14/2019   Procedure: CYSTOSCOPY;  Surgeon: [name], MD;  Location: [facility];  Service: Gynecology;  Laterality: N/A;   TOTAL LAPAROSCOPIC HYSTERECTOMY WITH SALPINGECTOMY N/A 10/14/2019   Procedure: TOTAL LAPAROSCOPIC HYSTERECTOMY WITH BILATERAL SALPINGECTOMY;  Surgeon: [name], MD;  Location: [facility];  Service: Gynecology;  Laterality: N/A;  3 hours total   WISDOM TOOTH EXTRACTION  Highschool   Allergies  Allergen Reactions   Clindamycin/Lincomycin Cough   Penicillins Rash    throat spasm Other reaction(s): Unknown, vomiting   Other Itching    Walnut   Azithromycin Diarrhea    chest tigntness   Guaifenesin     REACTION: throat spasms      Objective:  Physical Exam Vitals and nursing note reviewed.  Constitutional:      Appearance: Normal appearance.  Cardiovascular:     Rate and Rhythm: Normal rate and regular rhythm.  Pulmonary:     Effort: Pulmonary effort is normal.     Breath sounds: Normal breath sounds.  Abdominal:     General: There is no distension.     Tenderness: There is no abdominal tenderness (reports worsening of pain when lying back). There is no right CVA tenderness, left CVA tenderness or guarding.     Hernia: No hernia is present.  Musculoskeletal:        General: Normal range of motion.  Skin:    General: Skin is warm and dry.  Neurological:     Mental Status: She is alert.  Psychiatric:        Mood and Affect: Mood normal.        Behavior: Behavior normal.    BP 119/74 (BP  Location: Left Arm, Patient Position: Sitting, Cuff Size: Large)   Pulse 98   Temp 98.4 F (36.9 C) (Temporal)   Ht '5\' 4"'$  (1.626 m)   Wt 131 lb 8 oz (59.6 kg)   LMP 10/03/2019   SpO2 100%   BMI 22.57 kg/m  Wt Readings from Last 3 Encounters:  04/15/22 131 lb 8 oz (59.6 kg)  03/04/22 135 lb 2 oz (61.3 kg)  11/24/21 138 lb 2 oz (62.7 kg)       Jeanie Sewer, NP

## 2022-04-16 ENCOUNTER — Ambulatory Visit (HOSPITAL_COMMUNITY)
Admission: RE | Admit: 2022-04-16 | Discharge: 2022-04-16 | Disposition: A | Discharge status: Home / Self Care | Payer: Managed Care, Other (non HMO) | Source: Ambulatory Visit | Attending: Family | Admitting: Family

## 2022-04-16 DIAGNOSIS — M546 Pain in thoracic spine: Secondary | ICD-10-CM | POA: Diagnosis present

## 2022-04-16 DIAGNOSIS — R1013 Epigastric pain: Secondary | ICD-10-CM

## 2022-04-17 NOTE — Progress Notes (Signed)
As you can see, your Ultrasound is normal. Hopefully you are feeling a little better after increasing the Protonix. If the pain returns or worsens after reducing your dose again then you should follow up with your gastroenterologist to rule out an ulcer (requires endoscopy).  Take care.

## 2022-06-20 ENCOUNTER — Encounter: Payer: Self-pay | Admitting: Obstetrics and Gynecology

## 2022-06-20 NOTE — Telephone Encounter (Signed)
Last PAP 08/02/2021. She has one in 2022 and 2021.   She reports hx of abd Pap smear.

## 2022-08-04 ENCOUNTER — Ambulatory Visit: Payer: No Typology Code available for payment source | Admitting: Obstetrics and Gynecology

## 2022-08-22 ENCOUNTER — Other Ambulatory Visit: Payer: Self-pay | Admitting: Family Medicine

## 2023-02-28 ENCOUNTER — Encounter: Payer: Self-pay | Admitting: Family Medicine

## 2023-02-28 ENCOUNTER — Other Ambulatory Visit: Payer: Self-pay | Admitting: Family Medicine

## 2023-03-16 ENCOUNTER — Encounter: Payer: Managed Care, Other (non HMO) | Admitting: Family Medicine

## 2023-04-13 ENCOUNTER — Encounter: Payer: Self-pay | Admitting: Family Medicine

## 2023-04-13 ENCOUNTER — Ambulatory Visit (INDEPENDENT_AMBULATORY_CARE_PROVIDER_SITE_OTHER): Payer: BC Managed Care – PPO | Admitting: Family Medicine

## 2023-04-13 VITALS — BP 114/76 | HR 75 | Temp 97.5°F | Resp 16 | Ht 64.0 in | Wt 137.5 lb

## 2023-04-13 DIAGNOSIS — I7300 Raynaud's syndrome without gangrene: Secondary | ICD-10-CM | POA: Insufficient documentation

## 2023-04-13 DIAGNOSIS — Z0000 Encounter for general adult medical examination without abnormal findings: Secondary | ICD-10-CM | POA: Diagnosis not present

## 2023-04-13 DIAGNOSIS — Z1159 Encounter for screening for other viral diseases: Secondary | ICD-10-CM | POA: Diagnosis not present

## 2023-04-13 DIAGNOSIS — K219 Gastro-esophageal reflux disease without esophagitis: Secondary | ICD-10-CM

## 2023-04-13 MED ORDER — AMLODIPINE BESYLATE 2.5 MG PO TABS: 2.5000 mg | ORAL_TABLET | Freq: Every day | ORAL | 1 refills | Status: DC

## 2023-04-13 MED ORDER — PANTOPRAZOLE SODIUM 40 MG PO TBEC: 40.0000 mg | DELAYED_RELEASE_TABLET | Freq: Every day | ORAL | 3 refills | Status: AC

## 2023-04-13 NOTE — Patient Instructions (Signed)

## 2023-04-13 NOTE — Assessment & Plan Note (Signed)
 Chronic.  Controlled on pantoprazole 40mg  daily.  Doesn't want to see GI

## 2023-04-13 NOTE — Addendum Note (Signed)
 Addended by: Sumner Boast on: 04/13/2023 09:46 AM   Modules accepted: Orders

## 2023-04-13 NOTE — Progress Notes (Signed)
 Phone [phone]   Subjective:   Patient is a 43 y.o. female presenting for annual physical.    Chief Complaint  Patient presents with   Annual Exam    CPE No food, had black coffee   Annual-stressed. exercising GERD-on pantoprazole  40mg  daily.  Can't do w/o.  +dysphagia, no bleeding Pap thru gyn  See problem oriented charting- ROS- ROS: Gen: no fever, chills  Skin: no rash, itching [id number] Eyes: no blurry vision, double vision Resp: no cough, wheeze,SOB CV: no CP, , LE edema,   occ palp GI: no n/v/d/c, abd pain GU: no dysuria, urgency, frequency, hematuria MSK: no joint pain, myalgias, back pain.  Raynaud's-fingertips and toes painful when cold Neuro: no dizziness, headache, weakness, vertigo Psych: no depression, anxiety, insomnia, SI  Hair loss-plaquinel  The following were reviewed and entered/updated in epic: Past Medical History:  Diagnosis Date   Abnormal uterine bleeding    Allergy    Anxiety    Arthritis    hands, feet, back   Asthma    ASTHMA, EXERCISE INDUCED 04/09/2009   Family history of adverse reaction to anesthesia    N&V   GERD 04/09/2009   GERD (gastroesophageal reflux disease)    STD (sexually transmitted disease) 2014   HPV   Patient Active Problem List   Diagnosis Date Noted   Raynaud's disease without gangrene 04/13/2023   Dysphagia 07/27/2020   Iron deficiency anemia 07/27/2020   Rectal bleeding 07/27/2020   GAD (generalized anxiety disorder) 01/20/2020   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   S/P laparoscopic hysterectomy 10/14/2019   Abnormal Pap smear of cervix 08/21/2019   IBS (irritable bowel syndrome) 07/24/2019   ASTHMA, EXERCISE INDUCED 04/09/2009   GERD 04/09/2009   Past Surgical History:  Procedure Laterality Date   ABDOMINAL HYSTERECTOMY     COLPOSCOPY     CYSTOSCOPY N/A 10/14/2019   Procedure: CYSTOSCOPY;  Surgeon: Jannis Kate Norris, MD;  Location: [facility];  Service: Gynecology;  Laterality:  N/A;   LASIK     TOTAL LAPAROSCOPIC HYSTERECTOMY WITH SALPINGECTOMY N/A 10/14/2019   Procedure: TOTAL LAPAROSCOPIC HYSTERECTOMY WITH BILATERAL SALPINGECTOMY;  Surgeon: Jertson, Jill Evelyn, MD;  Location: [facility];  Service: Gynecology;  Laterality: N/A;  3 hours total   WISDOM TOOTH EXTRACTION  Highschool    Family History  Problem Relation Age of Onset   Cancer Mother        skin   Arthritis Mother    Hyperlipidemia Mother    Melanoma Mother    Varicose Veins Mother    Cancer Father        skin   Alcohol abuse Father        grandparents, aunt   Hyperlipidemia Father        grandparents   Hypertension Father    Diabetes Father    Arthritis Father    Stroke Father    Crohn's disease Sister    Cancer Maternal Grandmother 60       melanoma   Cirrhosis Maternal Grandmother        nash   Atrial fibrillation Maternal Grandfather    Cancer Paternal Grandmother 78       breast   Macular degeneration Paternal Grandmother    Breast cancer Other        materna-grandmother, maternal aunt    Medications- reviewed and updated Current Outpatient Medications  Medication Sig Dispense Refill   amLODipine  (NORVASC ) 2.5 MG tablet Take 1 tablet (2.5 mg total) by mouth daily.  90 tablet 1   cetirizine (ZYRTEC) 10 MG tablet Take 10 mg by mouth daily.     docusate sodium  (COLACE) 100 MG capsule Take 1 capsule (100 mg total) by mouth 2 (two) times daily. 30 capsule 0   fluticasone  (FLONASE ) 50 MCG/ACT nasal spray Place into the nose.     Folic Acid 0.8 MG CAPS Folic acid     hydroxychloroquine (PLAQUENIL) 200 MG tablet Take 200 mg by mouth as directed.     MAGNESIUM PO Take by mouth.     Melatonin 1 MG CHEW      psyllium (METAMUCIL SMOOTH TEXTURE) 28 % packet Take 1 packet by mouth daily.     pantoprazole  (PROTONIX ) 40 MG tablet Take 1 tablet (40 mg total) by mouth daily. 90 tablet 3   No current facility-administered medications for this visit.    Allergies-reviewed  and updated Allergies  Allergen Reactions   Azithromycin Diarrhea and Shortness Of Breath    chest tigntness   Clindamycin Shortness Of Breath   Clindamycin/Lincomycin Cough   Guaifenesin Shortness Of Breath    REACTION: throat spasms   Penicillins Rash and Other (See Comments)    throat spasm  Other reaction(s): Unknown, vomiting  Other Reaction(s): Vomiting  throat spasm Other reaction(s): Unknown, vomiting   Other Itching    Walnut   Walnut Itching    Social History   Social History Narrative   1 step   X-ray tech   Objective  Objective:  BP 114/76   Pulse 75   Temp (!) 97.5 F (36.4 C) (Temporal)   Resp 16   Ht 5' 4 (1.626 m)   Wt 137 lb 8 oz (62.4 kg)   LMP 10/03/2019   SpO2 100%   BMI 23.60 kg/m  Physical Exam  Gen: WDWN NAD HEENT: NCAT, conjunctiva not injected, sclera nonicteric TM WNL B, OP moist, no exudates  NECK:  supple, no thyromegaly, no nodes, no carotid bruits CARDIAC: RRR, S1S2+, no murmur. DP 2+B LUNGS: CTAB. No wheezes ABDOMEN:  BS+, soft, NTND, No HSM, no masses EXT:  no edema MSK: no gross abnormalities. MS 5/5 all 4 NEURO: A&O x3.  CN II-XII intact.  PSYCH: normal mood. Good eye contact     Assessment and Plan   Health Maintenance counseling: 1. Anticipatory guidance: Patient counseled regarding regular dental exams q6 months, eye exams,  avoiding smoking and second hand smoke, limiting alcohol to 1 beverage per day, no illicit drugs.   2. Risk factor reduction:  Advised patient of need for regular exercise and diet rich and fruits and vegetables to reduce risk of heart attack and stroke. Exercise- some.  Wt Readings from Last 3 Encounters:  04/13/23 137 lb 8 oz (62.4 kg)  04/15/22 131 lb 8 oz (59.6 kg)  03/04/22 135 lb 2 oz (61.3 kg)   3. Immunizations/screenings/ancillary studies Immunization History  Administered Date(s) Administered   Influenza Split 02/03/2012   Influenza Whole 01/03/2019   PFIZER(Purple Top)SARS-COV-2  Vaccination 01/16/2020   Tdap 07/24/2019   Health Maintenance Due  Topic Date Due   Hepatitis C Screening  Never done    4. Cervical cancer screening- will sch 5. Breast cancer screening-  mammogram will sch 6. Colon cancer screening - n/a 7. Skin cancer screening- advised regular sunscreen use. Denies worrisome, changing, or new skin lesions.  8. Birth control/STD check- hyst 9. Osteoporosis screening- n/a 10. Smoking associated screening - non smoker  Wellness examination -     CBC with  Differential/Platelet -     Comprehensive metabolic panel -     Lipid panel -     TSH -     Hemoglobin A1c -     Hepatitis C antibody  Screening for viral disease -     Hepatitis C antibody  Raynaud's disease without gangrene Assessment & Plan: Chronic.  Some symptoms.  Wil do amlodipine  2.5mg  daily  Orders: -     amLODIPine  Besylate; Take 1 tablet (2.5 mg total) by mouth daily.  Dispense: 90 tablet; Refill: 1  Gastroesophageal reflux disease without esophagitis Assessment & Plan: Chronic.  Controlled on pantoprazole  40mg  daily.  Doesn't want to see GI  Orders: -     Pantoprazole  Sodium; Take 1 tablet (40 mg total) by mouth daily.  Dispense: 90 tablet; Refill: 3   Wellness-anticipatory guidance.  Work on Diet/Exercise  Check CBC,CMP,lipids,TSH, A1C.  F/u 1 yr   Recommended follow up: Return in about 1 year (around 04/12/2024) for annual physical.  Lab/Order associations:+ fasting  Jenkins CHRISTELLA Carrel, MD

## 2023-04-13 NOTE — Assessment & Plan Note (Signed)
 Chronic.  Some symptoms.  Wil do amlodipine 2.5mg  daily

## 2023-04-13 NOTE — Addendum Note (Signed)
 Addended by: Angelena Sole on: 04/13/2023 09:45 AM   Modules accepted: Orders

## 2023-04-15 LAB — COMPREHENSIVE METABOLIC PANEL
ALT: 13 [IU]/L (ref 0–32)
AST: 16 [IU]/L (ref 0–40)
Albumin: 4.6 g/dL (ref 3.9–4.9)
Alkaline Phosphatase: 45 [IU]/L (ref 44–121)
BUN/Creatinine Ratio: 11 (ref 9–23)
BUN: 8 mg/dL (ref 6–24)
Bilirubin Total: 0.4 mg/dL (ref 0.0–1.2)
CO2: 22 mmol/L (ref 20–29)
Calcium: 9.2 mg/dL (ref 8.7–10.2)
Chloride: 106 mmol/L (ref 96–106)
Creatinine, Ser: 0.74 mg/dL (ref 0.57–1.00)
Globulin, Total: 2.2 g/dL (ref 1.5–4.5)
Glucose: 90 mg/dL (ref 70–99)
Potassium: 4.7 mmol/L (ref 3.5–5.2)
Sodium: 143 mmol/L (ref 134–144)
Total Protein: 6.8 g/dL (ref 6.0–8.5)
eGFR: 104 mL/min/{1.73_m2} (ref >59)

## 2023-04-15 LAB — CBC WITH DIFFERENTIAL/PLATELET

## 2023-04-15 LAB — LIPID PANEL
Chol/HDL Ratio: 2.5 {ratio} (ref 0.0–4.4)
Cholesterol, Total: 203 mg/dL — ABNORMAL HIGH (ref 100–199)
HDL: 81 mg/dL (ref >39)
LDL Chol Calc (NIH): 110 mg/dL — ABNORMAL HIGH (ref 0–99)
Triglycerides: 64 mg/dL (ref 0–149)
VLDL Cholesterol Cal: 12 mg/dL (ref 5–40)

## 2023-04-15 LAB — HEMOGLOBIN A1C

## 2023-04-15 LAB — TSH: TSH: 2.24 u[IU]/mL (ref 0.450–4.500)

## 2023-04-17 LAB — CBC WITH DIFFERENTIAL/PLATELET
Basophils Absolute: 0.1 10*3/uL (ref 0.0–0.2)
Basos: 1 %
EOS (ABSOLUTE): 0.1 10*3/uL (ref 0.0–0.4)
Eos: 2 %
Hematocrit: 43.2 % (ref 34.0–46.6)
Hemoglobin: 13.6 g/dL (ref 11.1–15.9)
Immature Grans (Abs): 0 10*3/uL (ref 0.0–0.1)
Immature Granulocytes: 0 %
Lymphocytes Absolute: 1.7 10*3/uL (ref 0.7–3.1)
Lymphs: 45 %
MCH: 31.3 pg (ref 26.6–33.0)
MCHC: 31.5 g/dL (ref 31.5–35.7)
MCV: 100 fL — ABNORMAL HIGH (ref 79–97)
Monocytes Absolute: 0.3 10*3/uL (ref 0.1–0.9)
Monocytes: 8 %
Neutrophils Absolute: 1.6 10*3/uL (ref 1.4–7.0)
Neutrophils: 44 %
Platelets: 349 10*3/uL (ref 150–450)
RBC: 4.34 x10E6/uL (ref 3.77–5.28)
RDW: 11.3 % — ABNORMAL LOW (ref 11.7–15.4)
WBC: 3.7 10*3/uL (ref 3.4–10.8)

## 2023-04-17 LAB — HEMOGLOBIN A1C
Est. average glucose Bld gHb Est-mCnc: 103 mg/dL
Hgb A1c MFr Bld: 5.2 % (ref 4.8–5.6)

## 2023-04-17 LAB — SPECIMEN STATUS REPORT

## 2023-04-18 NOTE — Progress Notes (Signed)
 Labs are great

## 2023-04-19 LAB — HEPATITIS C ANTIBODY: Hep C Virus Ab: NONREACTIVE

## 2023-11-08 ENCOUNTER — Encounter: Payer: Self-pay | Admitting: Family

## 2023-11-08 ENCOUNTER — Ambulatory Visit (INDEPENDENT_AMBULATORY_CARE_PROVIDER_SITE_OTHER): Admitting: Family

## 2023-11-08 VITALS — BP 120/74 | HR 75 | Temp 97.3°F | Ht 64.0 in | Wt 136.6 lb

## 2023-11-08 DIAGNOSIS — L918 Other hypertrophic disorders of the skin: Secondary | ICD-10-CM | POA: Diagnosis not present

## 2023-11-08 NOTE — Progress Notes (Signed)
 Patient ID: Cathy Austin, female    DOB: 09/25/1980, 43 y.o.   MRN: [id number]  Chief Complaint  Patient presents with   Stye    Pt c/o stye on left eye, present for a couple of months. Pt denies any pain.   Discussed the use of AI scribe software for clinical note transcription with the patient, who gave verbal consent to proceed.  History of Present Illness Cathy Austin Doiron is a 43 year old female who presents with recurrent styes and a persistent skin tag on her eyelid.  Recurrent eyelid styes - Experiences recurrent styes on the eyelids, typically resolving spontaneously within one week - Most recent stye occurred on the lower eyelid and resolved yesterday after a few days - Occasionally uses warm compresses to aid in resolution - No current stye present - Father also experiences frequent styes  Eyelid skin tag - Persistent skin tag present on the upper eyelid - Causes discomfort due to its location - Initially thought to be a stye  Assessment & Plan Recurrent styes of eyelid Recurrent styes likely due to tear duct or eyelash follicle blockage. Family history suggests genetic predisposition. - Advise warm compresses for stye management. - Recommend using baby shampoo to gently scrub eyelash line daily to prevent blockage.  Skin tag of eyelid Skin tag on upper eyelid causing irritation, initially misidentified as a stye. Access to dermatological care established. - Recommend dermatologist consultation for potential removal via cryotherapy or excision.   Subjective:    Outpatient Medications Prior to Visit  Medication Sig Dispense Refill   amLODipine  (NORVASC ) 2.5 MG tablet Take 1 tablet (2.5 mg total) by mouth daily. 90 tablet 1   cetirizine (ZYRTEC) 10 MG tablet Take 10 mg by mouth daily.     docusate sodium  (COLACE) 100 MG capsule Take 1 capsule (100 mg total) by mouth 2 (two) times daily. 30 capsule 0   fluticasone  (FLONASE ) 50 MCG/ACT nasal  spray Place into the nose.     Folic Acid 0.8 MG CAPS Folic acid     hydroxychloroquine (PLAQUENIL) 200 MG tablet Take 200 mg by mouth as directed.     MAGNESIUM PO Take by mouth.     Melatonin 1 MG CHEW      pantoprazole  (PROTONIX ) 40 MG tablet Take 1 tablet (40 mg total) by mouth daily. 90 tablet 3   psyllium (METAMUCIL SMOOTH TEXTURE) 28 % packet Take 1 packet by mouth daily.     No facility-administered medications prior to visit.   Past Medical History:  Diagnosis Date   Abnormal uterine bleeding    Allergy    Anxiety    Arthritis    hands, feet, back   Asthma    ASTHMA, EXERCISE INDUCED 04/09/2009   Family history of adverse reaction to anesthesia    N&V   GERD 04/09/2009   GERD (gastroesophageal reflux disease)    STD (sexually transmitted disease) 2014   HPV   Past Surgical History:  Procedure Laterality Date   ABDOMINAL HYSTERECTOMY     COLPOSCOPY     CYSTOSCOPY N/A 10/14/2019   Procedure: CYSTOSCOPY;  Surgeon: Jannis Kate Norris, MD;  Location: [facility];  Service: Gynecology;  Laterality: N/A;   LASIK     TOTAL LAPAROSCOPIC HYSTERECTOMY WITH SALPINGECTOMY N/A 10/14/2019   Procedure: TOTAL LAPAROSCOPIC HYSTERECTOMY WITH BILATERAL SALPINGECTOMY;  Surgeon: Jertson, Jill Evelyn, MD;  Location: [facility];  Service: Gynecology;  Laterality: N/A;  3 hours total  WISDOM TOOTH EXTRACTION  Highschool   Allergies  Allergen Reactions   Azithromycin Diarrhea and Shortness Of Breath    chest tigntness   Clindamycin Shortness Of Breath   Clindamycin/Lincomycin Cough   Guaifenesin Shortness Of Breath    REACTION: throat spasms   Penicillins Rash and Other (See Comments)    throat spasm  Other reaction(s): Unknown, vomiting  Other Reaction(s): Vomiting  throat spasm Other reaction(s): Unknown, vomiting   Other Itching    Walnut   Walnut Itching      Objective:    Physical Exam Vitals and nursing note reviewed.  Constitutional:       Appearance: Normal appearance.  Eyes:     Extraocular Movements: Extraocular movements intact.     Conjunctiva/sclera: Conjunctivae normal.  Cardiovascular:     Rate and Rhythm: Normal rate and regular rhythm.  Pulmonary:     Effort: Pulmonary effort is normal.     Breath sounds: Normal breath sounds.  Musculoskeletal:        General: Normal range of motion.  Skin:    General: Skin is warm and dry.     Findings: Lesion (pinpoint, skin colored-whitish, raised skin tag on left upper eyelid) present.  Neurological:     Mental Status: She is alert.  Psychiatric:        Mood and Affect: Mood normal.        Behavior: Behavior normal.    BP 120/74 (BP Location: Left Arm, Patient Position: Sitting, Cuff Size: Normal)   Pulse 75   Temp (!) 97.3 F (36.3 C) (Temporal)   Ht 5' 4 (1.626 m)   Wt 136 lb 9.6 oz (62 kg)   LMP 10/03/2019   SpO2 98%   BMI 23.45 kg/m  Wt Readings from Last 3 Encounters:  11/08/23 136 lb 9.6 oz (62 kg)  04/13/23 137 lb 8 oz (62.4 kg)  04/15/22 131 lb 8 oz (59.6 kg)      Cathy Krabbe, NP

## 2024-02-05 ENCOUNTER — Other Ambulatory Visit: Payer: Self-pay | Admitting: Family Medicine

## 2024-02-05 DIAGNOSIS — L570 Actinic keratosis: Secondary | ICD-10-CM | POA: Diagnosis not present

## 2024-02-05 DIAGNOSIS — I7300 Raynaud's syndrome without gangrene: Secondary | ICD-10-CM

## 2024-02-05 DIAGNOSIS — L738 Other specified follicular disorders: Secondary | ICD-10-CM | POA: Diagnosis not present

## 2024-02-05 DIAGNOSIS — L6611 Classic lichen planopilaris: Secondary | ICD-10-CM | POA: Diagnosis not present

## 2024-04-16 ENCOUNTER — Encounter: Payer: BC Managed Care – PPO | Admitting: Family Medicine

## 2024-05-27 ENCOUNTER — Encounter: Admitting: Family Medicine
